# Patient Record
Sex: Male | Born: 1965 | Race: White | Hispanic: No | Marital: Married | Smoking: Current every day smoker
Health system: Southern US, Community
[De-identification: ages and names within clinical notes are randomized; demographics above are authoritative.]

## PROBLEM LIST (undated history)

## (undated) DIAGNOSIS — K219 Gastro-esophageal reflux disease without esophagitis: Secondary | ICD-10-CM

## (undated) DIAGNOSIS — L409 Psoriasis, unspecified: Secondary | ICD-10-CM

## (undated) DIAGNOSIS — E785 Hyperlipidemia, unspecified: Secondary | ICD-10-CM

## (undated) DIAGNOSIS — J45909 Unspecified asthma, uncomplicated: Secondary | ICD-10-CM

## (undated) DIAGNOSIS — G709 Myoneural disorder, unspecified: Secondary | ICD-10-CM

## (undated) DIAGNOSIS — E7800 Pure hypercholesterolemia, unspecified: Secondary | ICD-10-CM

## (undated) DIAGNOSIS — K7460 Unspecified cirrhosis of liver: Secondary | ICD-10-CM

## (undated) DIAGNOSIS — I10 Essential (primary) hypertension: Secondary | ICD-10-CM

## (undated) DIAGNOSIS — T7840XA Allergy, unspecified, initial encounter: Secondary | ICD-10-CM

## (undated) DIAGNOSIS — J449 Chronic obstructive pulmonary disease, unspecified: Secondary | ICD-10-CM

## (undated) DIAGNOSIS — R569 Unspecified convulsions: Secondary | ICD-10-CM

## (undated) HISTORY — DX: Unspecified convulsions: R56.9

## (undated) HISTORY — PX: HERNIA REPAIR: SHX51

## (undated) HISTORY — DX: Allergy, unspecified, initial encounter: T78.40XA

## (undated) HISTORY — DX: Myoneural disorder, unspecified: G70.9

## (undated) HISTORY — DX: Gastro-esophageal reflux disease without esophagitis: K21.9

---

## 2006-11-07 ENCOUNTER — Emergency Department: Payer: Self-pay | Admitting: Emergency Medicine

## 2008-04-12 ENCOUNTER — Emergency Department: Payer: Self-pay | Admitting: Emergency Medicine

## 2008-08-25 ENCOUNTER — Emergency Department: Payer: Self-pay | Admitting: Emergency Medicine

## 2011-04-03 LAB — ETHANOL: Ethanol %: <0.003 % (ref 0.000–0.080)

## 2011-04-03 LAB — DRUG SCREEN, URINE
Amphetamines, Ur Screen: NEGATIVE (ref ?–1000)
Benzodiazepine, Ur Scrn: NEGATIVE (ref ?–200)
MDMA (Ecstasy)Ur Screen: NEGATIVE (ref ?–500)
Methadone, Ur Screen: NEGATIVE (ref ?–300)
Phencyclidine (PCP) Ur S: NEGATIVE (ref ?–25)

## 2011-04-03 LAB — COMPREHENSIVE METABOLIC PANEL
Albumin: 4.9 g/dL (ref 3.4–5.0)
Alkaline Phosphatase: 45 U/L — ABNORMAL LOW (ref 50–136)
BUN: 13 mg/dL (ref 7–18)
Chloride: 107 mmol/L (ref 98–107)
Co2: 23 mmol/L (ref 21–32)
EGFR (African American): >60
Glucose: 93 mg/dL (ref 65–99)
Osmolality: 281 (ref 275–301)
SGOT(AST): 26 U/L (ref 15–37)
SGPT (ALT): 34 U/L
Sodium: 141 mmol/L (ref 136–145)
Total Protein: 8 g/dL (ref 6.4–8.2)

## 2011-04-03 LAB — URINALYSIS, COMPLETE
Leukocyte Esterase: NEGATIVE
Ph: 5 (ref 4.5–8.0)
Protein: NEGATIVE
RBC,UR: 1 /HPF (ref 0–5)

## 2011-04-03 LAB — TSH: Thyroid Stimulating Horm: 0.99 u[IU]/mL

## 2011-04-03 LAB — CBC
HGB: 17 g/dL (ref 13.0–18.0)
MCH: 32.2 pg (ref 26.0–34.0)
MCHC: 33.7 g/dL (ref 32.0–36.0)
Platelet: 213 10*3/uL (ref 150–440)
RBC: 5.29 10*6/uL (ref 4.40–5.90)
RDW: 12.7 % (ref 11.5–14.5)

## 2011-04-04 ENCOUNTER — Inpatient Hospital Stay: Payer: Self-pay | Admitting: Psychiatry

## 2011-04-05 LAB — LIPID PANEL
Ldl Cholesterol, Calc: 123 mg/dL — ABNORMAL HIGH (ref 0–100)
VLDL Cholesterol, Calc: 31 mg/dL (ref 5–40)

## 2011-04-05 LAB — CBC WITH DIFFERENTIAL/PLATELET
Basophil #: 0.1 10*3/uL (ref 0.0–0.1)
Eosinophil %: 1.6 %
Lymphocyte #: 1.9 10*3/uL (ref 1.0–3.6)
Monocyte #: 0.7 10*3/uL (ref 0.0–0.7)
Monocyte %: 10.7 %
Neutrophil %: 58.4 %
Platelet: 195 10*3/uL (ref 150–440)
RBC: 5.01 10*6/uL (ref 4.40–5.90)
RDW: 12.8 % (ref 11.5–14.5)
WBC: 6.7 10*3/uL (ref 3.8–10.6)

## 2011-04-05 LAB — FOLATE: Folic Acid: 9.2 ng/mL (ref 3.1–100.0)

## 2011-12-07 ENCOUNTER — Emergency Department: Payer: Self-pay | Admitting: Emergency Medicine

## 2012-04-12 ENCOUNTER — Emergency Department: Payer: Self-pay | Admitting: Internal Medicine

## 2012-04-17 ENCOUNTER — Other Ambulatory Visit: Payer: Self-pay

## 2012-04-17 LAB — HEPATIC FUNCTION PANEL A (ARMC)
Albumin: 4.4 g/dL (ref 3.4–5.0)
Alkaline Phosphatase: 58 U/L (ref 50–136)
SGPT (ALT): 28 U/L (ref 12–78)

## 2012-04-23 ENCOUNTER — Ambulatory Visit: Payer: Self-pay

## 2012-05-01 ENCOUNTER — Ambulatory Visit: Payer: Self-pay

## 2012-05-01 LAB — HEPATIC FUNCTION PANEL A (ARMC)
Albumin: 4 g/dL (ref 3.4–5.0)
Alkaline Phosphatase: 54 U/L (ref 50–136)
SGOT(AST): 22 U/L (ref 15–37)
SGPT (ALT): 37 U/L (ref 12–78)
Total Protein: 7.3 g/dL (ref 6.4–8.2)

## 2012-05-01 LAB — PROTIME-INR: INR: 1

## 2012-05-01 LAB — CREATININE, SERUM
Creatinine: 1.05 mg/dL (ref 0.60–1.30)
EGFR (Non-African Amer.): >60

## 2013-08-03 DIAGNOSIS — G40909 Epilepsy, unspecified, not intractable, without status epilepticus: Secondary | ICD-10-CM | POA: Insufficient documentation

## 2013-08-03 DIAGNOSIS — K7460 Unspecified cirrhosis of liver: Secondary | ICD-10-CM | POA: Insufficient documentation

## 2013-08-03 DIAGNOSIS — J45909 Unspecified asthma, uncomplicated: Secondary | ICD-10-CM | POA: Insufficient documentation

## 2013-10-16 IMAGING — CR DG LUMBAR SPINE 2-3V
1 series · 3 of 3 positions shown · non-contrast
Comparison: none

REASON FOR EXAM: pinched nerve 2 hernia fax to [facility] [phone]
COMMENTS:

[Series 1: t lumbar spine ap · 0.14mm/px · 3 of 3 slices shown]
[im 1/3]
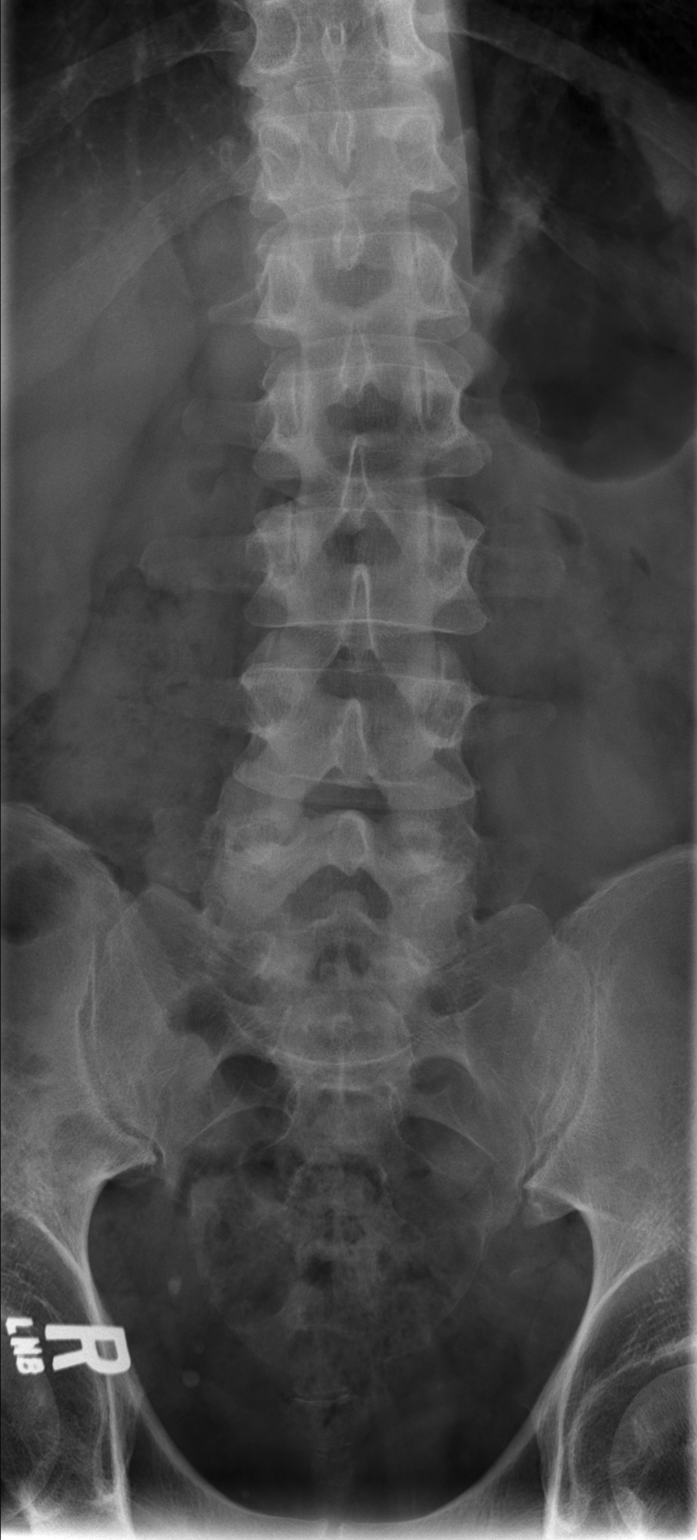
[im 2/3]
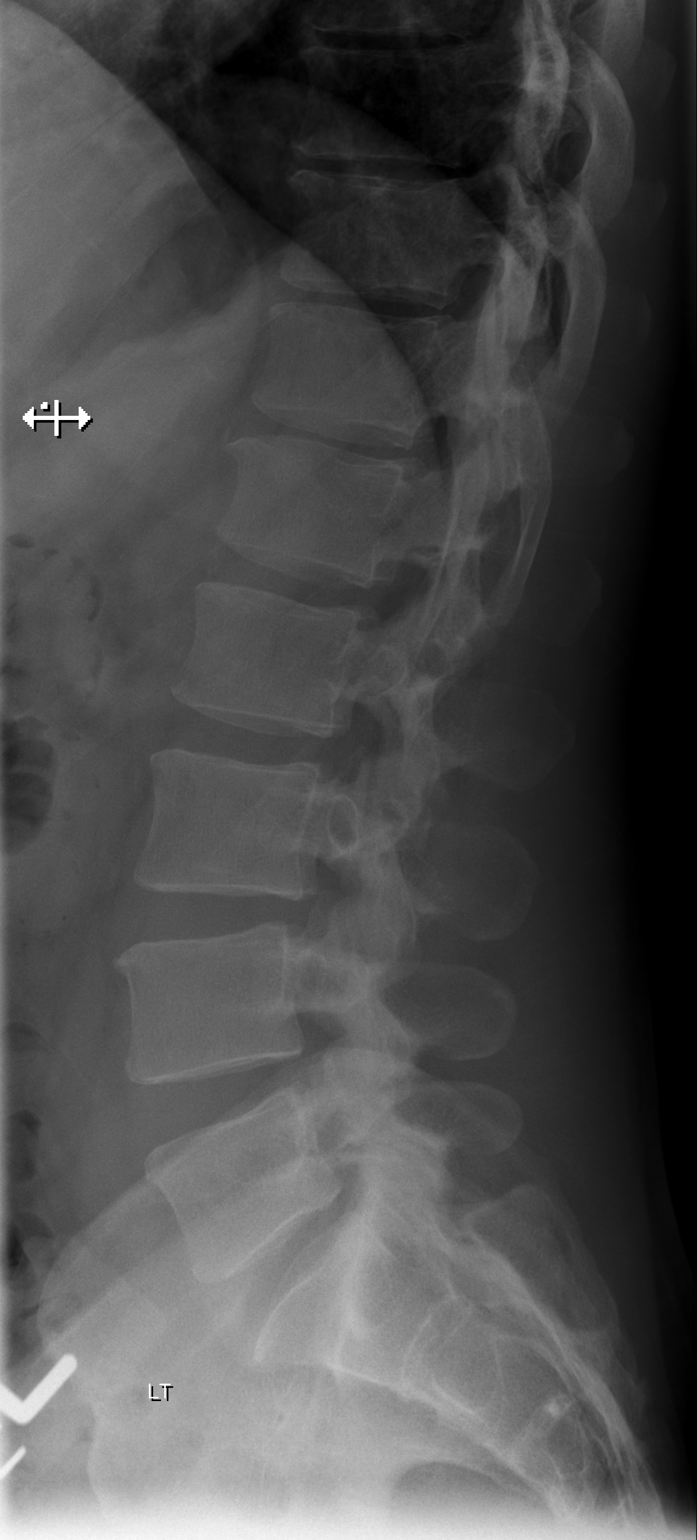
[im 3/3]
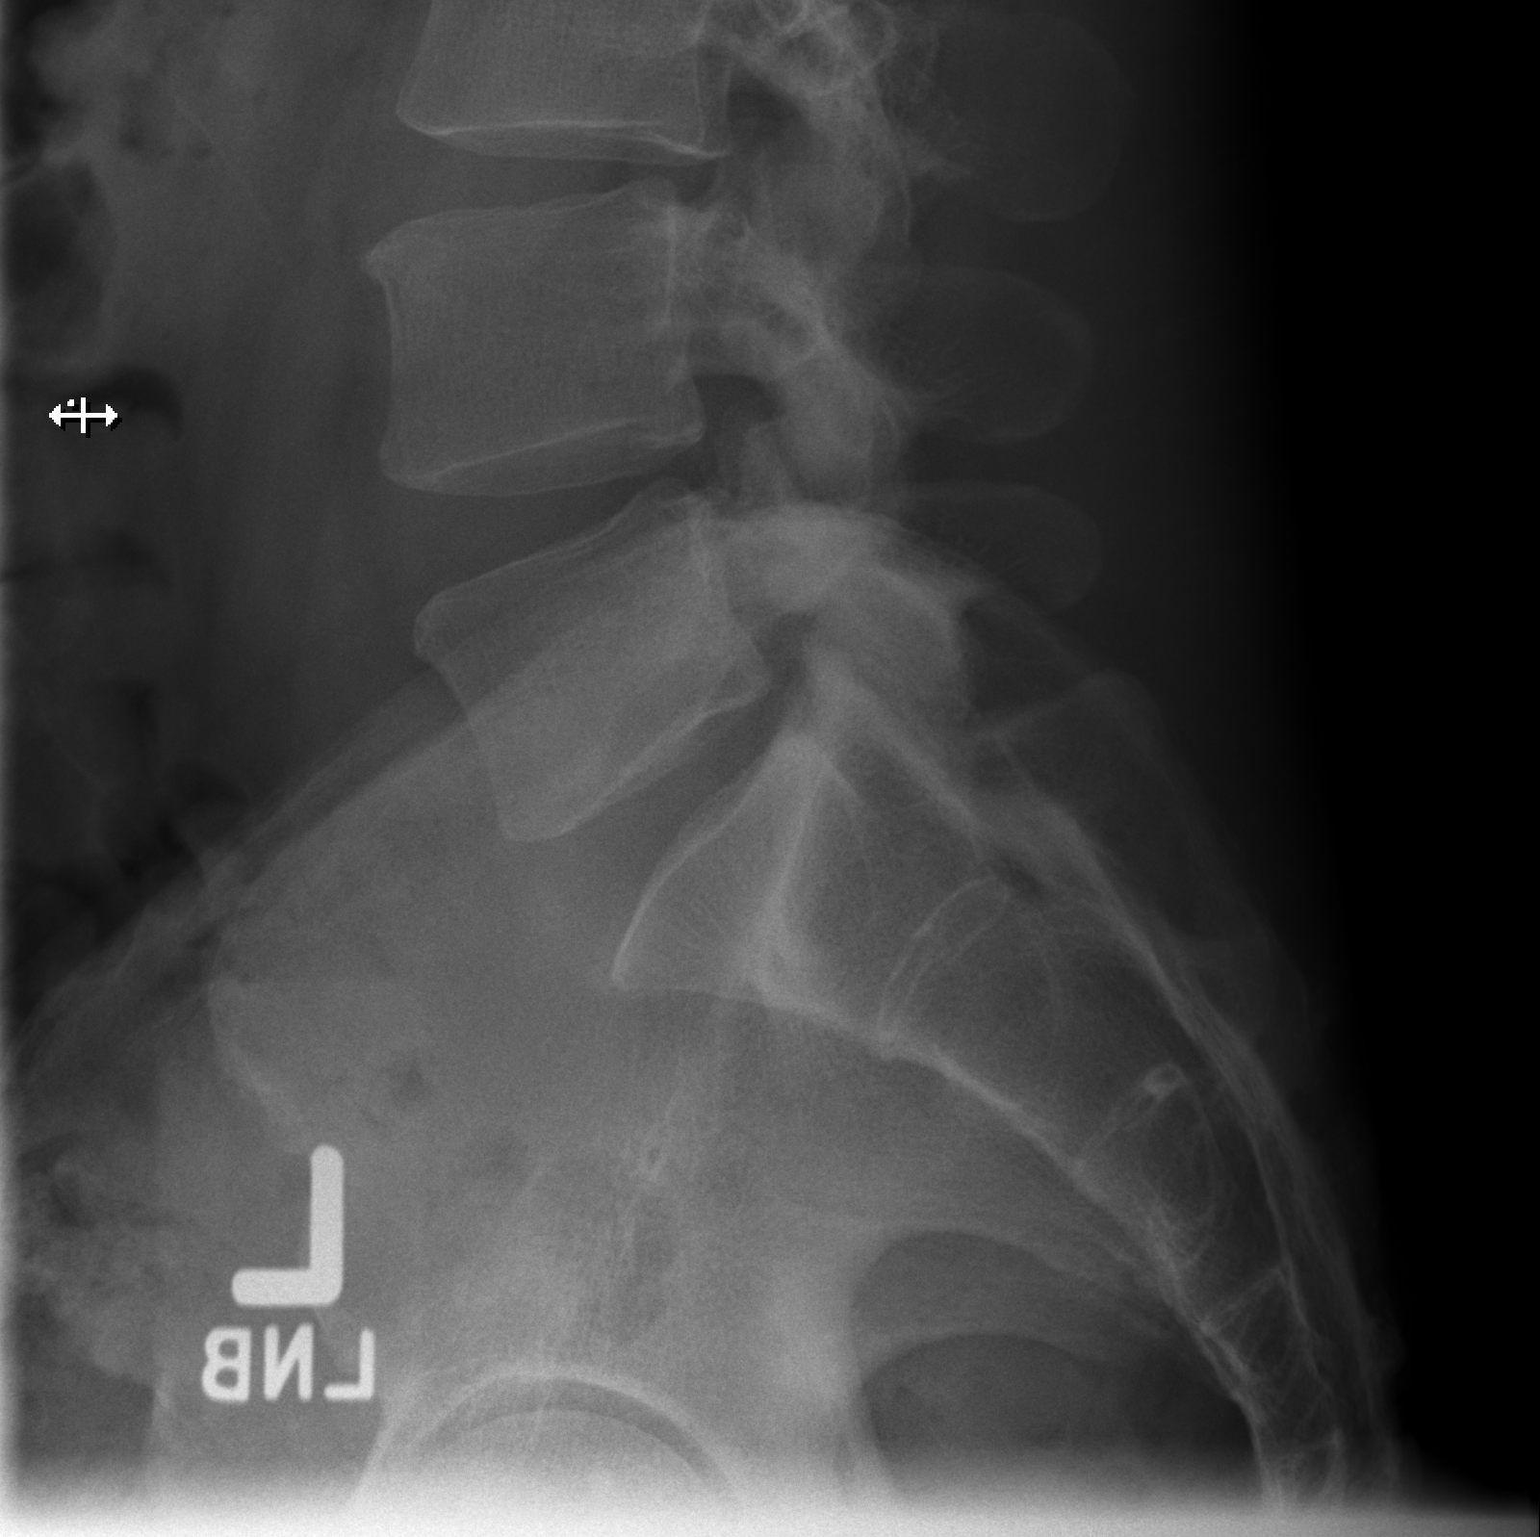

[3 of 3 positions shown; findings below may reference images not displayed]

PROCEDURE:     DXR - DXR LUMBAR SPINE AP AND LATERAL  - May 01, 2012  [DATE]

RESULT:     The lumbar vertebral bodies are preserved in height. The
intervertebral disc space heights are well-maintained. There is no
spondylolisthesis. The pedicles and transverse processes appear intact. The
observed portions of the sacrum are normal.
IMPRESSION: There is no acute bony abnormality of the lumbar spine. No
significant degenerative change is demonstrated either.

[REDACTED]

## 2014-05-12 LAB — CBC AND DIFFERENTIAL
Neutrophils Absolute: 4 /uL
WBC: 7.6 10*3/mL

## 2014-05-12 LAB — LIPID PANEL
Cholesterol: 230 mg/dL — AB (ref 0–200)
HDL: 43 mg/dL (ref 35–70)
Triglycerides: 707 mg/dL — AB (ref 40–160)

## 2014-05-12 LAB — TSH: TSH: 1.87 u[IU]/mL (ref 0.41–5.90)

## 2014-05-12 LAB — BASIC METABOLIC PANEL
BUN: 10 mg/dL (ref 4–21)
Creatinine: 1 mg/dL (ref 0.6–1.3)
GLUCOSE: 98 mg/dL
SODIUM: 140 mmol/L (ref 137–147)

## 2014-05-12 LAB — HEPATIC FUNCTION PANEL
BILIRUBIN DIRECT: 0.13 mg/dL (ref 0.01–0.4)
BILIRUBIN, TOTAL: 0.3 mg/dL

## 2014-07-17 NOTE — H&P (Signed)
PATIENT NAME:  Mathew Harper, Mathew Harper MR#:  [id number] DATE OF BIRTH:  05/10/1965  DATE OF ADMISSION:  04/04/2011  REFERRING PHYSICIAN: Maurilio Lovely, MD  ADMITTING PHYSICIAN: Caryn Section, MD  REASON FOR ADMISSION: Suicidal thoughts.   IDENTIFYING INFORMATION: Mathew Harper is a 49 year old currently separated Caucasian male living in the [place] area with his mother. He does have a history of depression and alcohol dependence. He is not currently receiving any outpatient psychiatric treatment.   HISTORY OF PRESENT ILLNESS: Mathew Harper is a 49 year old currently separated Caucasian male with a history of depression, per the patient, dating back to childhood and no prior inpatient or outpatient psychiatric treatment who voluntarily came on his own to the Emergency Room. The patient states that he walked from [facility] to the hospital due to worsening depressive symptoms and suicidal thoughts over the past 1-1/2 weeks. He denies any specific plan. The patient says he has been depressed secondary to a work-related back injury dating back 5 or 6 years, which has caused him not to be able to work. He has not also been able to find any job and due to financial problems he and his wife separated approximately six months ago. The patient does report problems with insomnia saying he only sleeps two to three hours per night as well as depressive symptoms, feelings of hopelessness and helplessness, frequent crying spells, decreased energy level, and anhedonia. He denies any change in appetite, weight gain or weight loss. He denies any history of any prior suicide attempts in the past or inpatient psychiatric hospitalizations. He denies any visual hallucinations, paranoid thoughts or delusions, but has been having auditory hallucinations at times telling him to kill himself. He was able to verbally contract for safety inside of the hospital, but was unable to contract for safety outside of the hospital. He has also been  drinking alcohol daily, about 7 to 8 beers, since his early 47s. The patient however says he has not had any alcohol in the past two weeks. He denies any other illicit drug use including cocaine, cannabis, opiate, or stimulant use. Toxicology screen in the emergency room was negative for all substances. Ethanol level was less than 3. He denies any symptoms consistent with bipolar mania including grandiose delusions, hyperreligious thoughts, hypersexual behavior, or impulsive behavior such as spending sprees or gambling.   PAST PSYCHIATRIC HISTORY: The patient reports he has had depression "his whole life". He denies any prior inpatient psychiatric hospitalizations or suicide attempts. He has not ever seen a psychiatrist, but he did talk to a counselor in childhood. He denies ever being on any psychotropic medications in the past.   SUBSTANCE ABUSE HISTORY: The patient reports he is drinking alcohol, 7 to 8 beers, daily since his early 35s. He does report alcohol related blackouts and shakes and tremors when he stops drinking. In addition he does have a seizure history. It is unclear whether or not these are alcohol withdrawal related seizures. The patient says he tried marijuana years ago, but he does not like it and does not use it on a regular basis. He denies any cocaine, opiate, or stimulant use. He used to smoke for a five year period, but then quit four years ago.   FAMILY PSYCHIATRIC HISTORY: The patient reports that his father was bipolar.   PAST MEDICAL HISTORY:  1. Asthma.  2. Chronic back pain. 3. Early stage of cirrhosis of the liver, per the patient. 4. Right hernia repair in 1982.  5. The  patient reports a history of seizures that he says sometimes occur 2 to 3 times a month. He was seen at [facility] and admitted after having a seizure back in 2004 but was not discharged on any seizure medications. It is still unclear whether or not these are alcohol withdrawal related seizures. No history of  any TBI.   OUTPATIENT MEDICATIONS: Albuterol, Advair, and he also states he takes over-the-counter sleeping pills.   ALLERGIES: No known drug allergies.   SOCIAL HISTORY: The patient says he was born and raised in [place], [place] and came to [place] over 20 years ago. He is currently living with his mother in [place]. His father passed away two years ago. His says his parents never divorced. He has two brothers and one sister. His sister lives in the house next door to him. He denies any history of any physical or sexual abuse. He has an eighth-grade education but never obtained his GED. The patient worked in the past operating heavy Engineer, manufacturing systems. He has been unemployed for the past four years. He is not currently on disability but is trying to apply for disability. 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 The patient has been married since 1988, but he separated from his wife six months ago. He has a 51 year old son and a 45 year old daughter. LEGAL HISTORY: He does have a history of three DUIs in the past. He denies any current pending charges.   MENTAL STATUS EXAM: Mathew Harper is a 49 year old Caucasian male who is wearing burgundy scrub pants and a burgundy scrub top. He was fully alert and oriented to time, place, and situation. He was somewhat disheveled. Speech was regular rate and rhythm, fluent and coherent. Mood was depressed and affect was congruent. Thought processes were linear, logical, and goal-directed. He did endorse suicidal thoughts but denied any specific plan. He denied any current homicidal thoughts. He has been having intermittent auditory hallucinations telling him to kill himself, but he denies any current hallucinations. He denies any visual hallucinations, paranoid thoughts, or delusions. Attention and concentration are fair to good. Recall was three out of three initially and three out of three after 5 minutes. Judgment and insight are  good. The patient did have difficulty with serial sevens after 93, but he could do simple calculations without difficulty. He had difficulty naming the presidents backwards after Bush. He was able to spell world backwards correctly. When asked about proverbs, the patient said "I don't know".  SUICIDE RISK ASSESSMENT: At this time Mr. Strollo is at a moderate level of risk for harm to self and others secondary to auditory hallucinations and suicidal thoughts. He says his brother-in-law has locked up his guns. In addition, he has other psychosocial stressors including chronic back pain, unemployment, and recent separation from his wife.  REVIEW OF SYSTEMS: CONSTITUTIONAL: The patient denies any weakness, fatigue, or weight changes. He denies any fever, chills, or night sweats. HEENT: He denies any headaches, dizziness, or change in vision including blurred vision. He denies any hearing loss. RESPIRATORY: He does complain of some shortness of breath and had some wheezing. No cough. CARDIOVASCULAR: He denies any chest pain or orthopnea. GASTROINTESTINAL: He denies any nausea or vomiting, but complain of some mild right lower quadrant pain. He denies any change in bowel movements. GENITOURINARY: He denies incontinence or problems with frequency of urine. ENDOCRINE: He denies any heat or cold intolerance. LYMPHATIC: He denies any anemia or easy bruising. MUSCULOSKELETAL: He denies  any muscle or joint pain. NEUROLOGIC: He denies any tingling or weakness. PSYCHIATRIC: Please see history of present illness.   PHYSICAL EXAMINATION:   VITAL SIGNS: Blood pressure 129/84, heart rate 100, respirations 18, and temperature 98.3.   HEENT: Normocephalic, atraumatic. Pupils are equal, round, and reactive to light and accommodation. Extraocular movements are intact. Oral mucosa was moist. No lesions noted.   NECK: Supple. No cervical lymphadenopathy or thyromegaly present.   LUNGS: The patient did have some wheezing  posteriorly bilaterally. No crackles, rales, or rhonchi.   HEART: S1 and S2 present, regular rate and rhythm. No murmurs, rubs or gallops.  ABDOMEN: Soft. Normoactive bowel sounds present in all four quadrants. The patient did have some mild right lower quadrant tenderness. No masses present. No distention. No rebound tenderness.   EXTREMITIES: +2 pedal pulses bilaterally. No rashes, cyanosis, clubbing, or edema.   NEUROLOGIC: Cranial nerves II through XII grossly intact. Gait was normal and steady.  LABS/STUDIES: Ethanol level was less than 3. Toxicology screen is negative for all substances. BMP is within normal limits. Glucose is 93, alkaline phosphatase 45, AST 26, and ALT 34. TSH is 0.99. White blood cell count is 15.6, hemoglobin 17.0, and platelet count 213.   Urinalysis is nitrite and leukocyte esterase negative, less than 1 WBC.  EKG showed a ventricular rate of 102 with a QTc interval of 411.   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  AXIS II: Deferred.   AXIS III: Asthma, chronic back pain, and cirrhosis of the liver.   AXIS IV: Severe - financial problems, separation from wife, occupational problems, history of legal problems.   AXIS V: GAF at present equals 25.   ASSESSMENT AND TREATMENT RECOMMENDATIONS: Mr. Thomasena EdisCollins is a 49 year old currently separated Caucasian male with a history of recurrent depression with psychotic features  as he is reporting auditory hallucinations telling him to kill himself. He denies any other psychotic features including visual hallucinations. He was unable to contract for safety outside of the hospital and has been having suicidal thoughts for the past 1-1/2 weeks. We will admit to inpatient psychiatry for medication management, safety, and stabilization.  1. 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 daily for  depression and anxiety and Risperdal 2 mg p.o. nightly for psychosis. We will also plan to start trazodone 50 mg p.o. nightly for insomnia. We will check EKG to rule out QTc prolongation, lipid panel in the a.m. as well as B12 and folate level in the a.m. 2. Alcohol dependence: The patient was advised to abstain from alcohol and all illicit drugs as it may worsen liver problems as well as mood symptoms. He has not had a drink in the past two weeks and ethanol level was less than 3. No need to place on CIWA for alcohol withdrawal symptoms, if he has truly not been drinking over the past two weeks. We will refer for outpatient substance abuse treatment as he is opposed to residential substance abuse treatment at this time.  3. Asthma: We will plan to restart albuterol and asthma inhalers. 4. Chronic back pain: We will give Ultram 50 mg p.o. every six hours p.r.n. for pain.  5. Leukocytosis: White blood cell count is 15.6. Chest x-ray is pending. We will plan to recheck WBC in the a.m. 6. Disposition: The patient has a stable living situation. Mental health follow-up will be with [facility] as the patient does not currently have healthcare insurance. The risks, benefits, and alternative treatments were discussed with the patient and the patient agreed to stay in the hospital voluntarily.  ____________________________ Doralee Albino. Maryruth Bun, MD akk:slb D: 04/04/2011 11:16:59 ET T: 04/04/2011 11:38:44 ET JOB#: [id number]  cc: Aarti K. Maryruth Bun, MD, <Dictator> Darliss Ridgel MD ELECTRONICALLY SIGNED 04/05/2011 19:36

## 2014-07-26 ENCOUNTER — Ambulatory Visit: Payer: Self-pay

## 2015-03-20 ENCOUNTER — Other Ambulatory Visit: Payer: Self-pay

## 2015-03-20 ENCOUNTER — Emergency Department: Payer: Self-pay

## 2015-03-20 ENCOUNTER — Emergency Department
Admission: EM | Admit: 2015-03-20 | Discharge: 2015-03-20 | Disposition: A | Discharge status: Home / Self Care | Payer: Self-pay | Attending: Emergency Medicine | Admitting: Emergency Medicine

## 2015-03-20 DIAGNOSIS — F17200 Nicotine dependence, unspecified, uncomplicated: Secondary | ICD-10-CM | POA: Insufficient documentation

## 2015-03-20 DIAGNOSIS — I10 Essential (primary) hypertension: Secondary | ICD-10-CM | POA: Insufficient documentation

## 2015-03-20 DIAGNOSIS — J441 Chronic obstructive pulmonary disease with (acute) exacerbation: Secondary | ICD-10-CM | POA: Insufficient documentation

## 2015-03-20 DIAGNOSIS — R000 Tachycardia, unspecified: Secondary | ICD-10-CM | POA: Insufficient documentation

## 2015-03-20 HISTORY — DX: Chronic obstructive pulmonary disease, unspecified: J44.9

## 2015-03-20 HISTORY — DX: Unspecified asthma, uncomplicated: J45.909

## 2015-03-20 HISTORY — DX: Essential (primary) hypertension: I10

## 2015-03-20 HISTORY — DX: Pure hypercholesterolemia, unspecified: E78.00

## 2015-03-20 LAB — COMPREHENSIVE METABOLIC PANEL
ALT: 21 U/L (ref 17–63)
AST: 25 U/L (ref 15–41)
Albumin: 4.4 g/dL (ref 3.5–5.0)
Alkaline Phosphatase: 31 U/L — ABNORMAL LOW (ref 38–126)
Anion gap: 7 (ref 5–15)
BILIRUBIN TOTAL: 0.9 mg/dL (ref 0.3–1.2)
BUN: 8 mg/dL (ref 6–20)
CO2: 25 mmol/L (ref 22–32)
Calcium: 8.6 mg/dL — ABNORMAL LOW (ref 8.9–10.3)
Chloride: 102 mmol/L (ref 101–111)
Creatinine, Ser: 1.14 mg/dL (ref 0.61–1.24)
GFR calc Af Amer: >60 mL/min (ref >60)
Glucose, Bld: 135 mg/dL — ABNORMAL HIGH (ref 65–99)
POTASSIUM: 3.4 mmol/L — AB (ref 3.5–5.1)
Sodium: 134 mmol/L — ABNORMAL LOW (ref 135–145)
TOTAL PROTEIN: 7.4 g/dL (ref 6.5–8.1)

## 2015-03-20 LAB — CBC
HEMATOCRIT: 43.6 % (ref 40.0–52.0)
Hemoglobin: 15 g/dL (ref 13.0–18.0)
MCH: 31.9 pg (ref 26.0–34.0)
MCHC: 34.4 g/dL (ref 32.0–36.0)
MCV: 92.8 fL (ref 80.0–100.0)
PLATELETS: 151 10*3/uL (ref 150–440)
RBC: 4.7 MIL/uL (ref 4.40–5.90)
RDW: 13.1 % (ref 11.5–14.5)
WBC: 6.7 10*3/uL (ref 3.8–10.6)

## 2015-03-20 LAB — BRAIN NATRIURETIC PEPTIDE: B Natriuretic Peptide: 27 pg/mL (ref 0.0–100.0)

## 2015-03-20 LAB — TROPONIN I

## 2015-03-20 MED ORDER — IPRATROPIUM-ALBUTEROL 0.5-2.5 (3) MG/3ML IN SOLN
3.0000 mL | Freq: Once | RESPIRATORY_TRACT | Status: AC
  Administered 2015-03-20: 3 mL via RESPIRATORY_TRACT
  Filled 2015-03-20: qty 9

## 2015-03-20 MED ORDER — IPRATROPIUM-ALBUTEROL 0.5-2.5 (3) MG/3ML IN SOLN
3.0000 mL | Freq: Once | RESPIRATORY_TRACT | Status: AC
  Administered 2015-03-20: 3 mL via RESPIRATORY_TRACT
  Filled 2015-03-20: qty 3

## 2015-03-20 MED ORDER — PREDNISONE 10 MG (21) PO TBPK: 10.0000 mg | ORAL_TABLET | Freq: Every day | ORAL | Status: DC

## 2015-03-20 MED ORDER — LEVOFLOXACIN 500 MG PO TABS: 500.0000 mg | ORAL_TABLET | Freq: Every day | ORAL | Status: DC

## 2015-03-20 MED ORDER — IPRATROPIUM-ALBUTEROL 0.5-2.5 (3) MG/3ML IN SOLN
3.0000 mL | Freq: Once | RESPIRATORY_TRACT | Status: AC
Start: 2015-03-20 — End: 2015-03-20
  Administered 2015-03-20: 3 mL via RESPIRATORY_TRACT
  Filled 2015-03-20: qty 3

## 2015-03-20 MED ORDER — METHYLPREDNISOLONE SODIUM SUCC 125 MG IJ SOLR
125.0000 mg | Freq: Once | INTRAMUSCULAR | Status: AC
Start: 2015-03-20 — End: 2015-03-20
  Administered 2015-03-20: 125 mg via INTRAVENOUS
  Filled 2015-03-20: qty 2

## 2015-03-20 NOTE — Discharge Instructions (Signed)

## 2015-03-20 NOTE — ED Notes (Signed)
Patient in xray.  Radiology to bring to room 11 when he returns.

## 2015-03-20 NOTE — ED Notes (Signed)
PT placed on 2L [place] to ease with WOB.

## 2015-03-20 NOTE — ED Notes (Signed)
Pt arrives by POV. Pt states that he became increasing SOB last night. PT states he is SOB at baselines. Pt increase WOB.

## 2015-03-20 NOTE — ED Provider Notes (Signed)
[facility] Emergency Department Provider Note     Time seen: ----------------------------------------- 1:33 PM on 03/20/2015 -----------------------------------------    I have reviewed the triage vital signs and the nursing notes.   HISTORY  Chief Complaint Shortness of Breath    HPI Mathew Harper is a 49 y.o. male who presents ER for increased shortness of breath since last night. Patient states he has shortness of breath at baseline but has had increased work of breathing and has been using his home breathing treatments including albuterol and Atrovent without any improvement. Patient states changes in the weather usually makes symptoms worse, has not had any fevers chills or acute cough.   Past Medical History  Diagnosis Date  . Asthma   . COPD (chronic obstructive pulmonary disease) (HCC)   . Hypertension   . Hypercholesteremia     There are no active problems to display for this patient.   History reviewed. No pertinent past surgical history.  Allergies Review of patient's allergies indicates no known allergies.  Social History Social History  Substance Use Topics  . Smoking status: Current Every Day Smoker  . Smokeless tobacco: None  . Alcohol Use: Yes    Review of Systems Constitutional: Negative for fever. Eyes: Negative for visual changes. ENT: Negative for sore throat. Cardiovascular: Negative for chest pain. Respiratory: Positive shortness of breath and cough Gastrointestinal: Negative for abdominal pain, vomiting and diarrhea. Genitourinary: Negative for dysuria. Musculoskeletal: Negative for back pain. Skin: Negative for rash. Neurological: Negative for headaches, focal weakness or numbness.  10-point ROS otherwise negative.  ____________________________________________   PHYSICAL EXAM:  VITAL SIGNS: ED Triage Vitals  Enc Vitals Group     BP 03/20/15 1241 125/79 mmHg     Pulse Rate 03/20/15 1241 103   Resp 03/20/15 1241 26     Temp 03/20/15 1241 98.7 F (37.1 C)     Temp Source 03/20/15 1241 Oral     SpO2 03/20/15 1241 96 %     Weight 03/20/15 1241 200 lb (90.719 kg)     Height 03/20/15 1241  (1.753 m)     Head Cir --      Peak Flow --      Pain Score 03/20/15 1242 5     Pain Loc --      Pain Edu? --      Excl. in GC? --     Constitutional: Alert and oriented. Mild distress Eyes: Conjunctivae are normal. PERRL. Normal extraocular movements. ENT   Head: Normocephalic and atraumatic.   Nose: No congestion/rhinnorhea.   Mouth/Throat: Mucous membranes are moist.   Neck: No stridor. Cardiovascular: Rapid rate, regular rhythm. Normal and symmetric distal pulses are present in all extremities. No murmurs, rubs, or gallops. Respiratory: Tachypnea with wheezing and rhonchi bilaterally. Gastrointestinal: Soft and nontender. No distention. No abdominal bruits.  Musculoskeletal: Nontender with normal range of motion in all extremities. No joint effusions.  No lower extremity tenderness nor edema. Neurologic:  Normal speech and language. No gross focal neurologic deficits are appreciated. Speech is normal. No gait instability. Skin:  Skin is warm, dry and intact. No rash noted. Psychiatric: Mood and affect are normal. Speech and behavior are normal. Patient exhibits appropriate insight and judgment. ____________________________________________  EKG: Interpreted by me. Sinus tachycardia with a rate of 109 bpm, normal axis. No evidence of acute infarction. Normal QRS width.   ____________________________________________  ED COURSE:  Pertinent labs & imaging results that were available during my care of the  patient were reviewed by me and considered in my medical decision making (see chart for details). She is in acute distress, will need serial DuoNeb since steroids. Likely this is COPD exacerbation ____________________________________________    LABS (pertinent  positives/negatives)  Labs Reviewed  COMPREHENSIVE METABOLIC PANEL - Abnormal; Notable for the following:    Sodium 134 (*)    Potassium 3.4 (*)    Glucose, Bld 135 (*)    Calcium 8.6 (*)    Alkaline Phosphatase 31 (*)    All other components within normal limits  CBC  BRAIN NATRIURETIC PEPTIDE  TROPONIN I    RADIOLOGY Images were viewed by me  Chest x-ray  CRITICAL CARE Performed by: Emily FilbertWilliams, Sandria Mcenroe E   Total critical care time: 30 minutes  Critical care time was exclusive of separately billable procedures and treating other patients.  Critical care was necessary to treat or prevent imminent or life-threatening deterioration.  Critical care was time spent personally by me on the following activities: development of treatment plan with patient and/or surrogate as well as nursing, discussions with consultants, evaluation of patient's response to treatment, examination of patient, obtaining history from patient or surrogate, ordering and performing treatments and interventions, ordering and review of laboratory studies, ordering and review of radiographic studies, pulse oximetry and re-evaluation of patient's condition.  ____________________________________________  FINAL ASSESSMENT AND PLAN  COPD exacerbation  Plan: Patient with labs and imaging as dictated above. Patient received stacked DuoNeb as well as Solu-Medrol. Currently his breathing is probably 50% better only still has some wheezing. Offered admission, patient prefers to go home at this time. Uvula steroid taper as well as oral antibiotics and encouragement to use inhalers at home as prescribed.   Emily FilbertWilliams, Kery Haltiwanger E, MD   Emily FilbertJonathan E Taevyn Hausen, MD 03/20/15 [phone]

## 2015-03-21 ENCOUNTER — Encounter: Payer: Self-pay | Admitting: Emergency Medicine

## 2015-03-21 ENCOUNTER — Emergency Department: Payer: Self-pay

## 2015-03-21 ENCOUNTER — Inpatient Hospital Stay
Admission: EM | Admit: 2015-03-21 | Discharge: 2015-03-23 | DRG: 191 | Disposition: A | Discharge status: Home / Self Care | Payer: Self-pay | Attending: Internal Medicine | Admitting: Internal Medicine

## 2015-03-21 DIAGNOSIS — F17210 Nicotine dependence, cigarettes, uncomplicated: Secondary | ICD-10-CM | POA: Diagnosis present

## 2015-03-21 DIAGNOSIS — Z79899 Other long term (current) drug therapy: Secondary | ICD-10-CM

## 2015-03-21 DIAGNOSIS — E871 Hypo-osmolality and hyponatremia: Secondary | ICD-10-CM | POA: Diagnosis present

## 2015-03-21 DIAGNOSIS — I10 Essential (primary) hypertension: Secondary | ICD-10-CM

## 2015-03-21 DIAGNOSIS — D72829 Elevated white blood cell count, unspecified: Secondary | ICD-10-CM

## 2015-03-21 DIAGNOSIS — Z716 Tobacco abuse counseling: Secondary | ICD-10-CM

## 2015-03-21 DIAGNOSIS — F329 Major depressive disorder, single episode, unspecified: Secondary | ICD-10-CM | POA: Diagnosis present

## 2015-03-21 DIAGNOSIS — R739 Hyperglycemia, unspecified: Secondary | ICD-10-CM

## 2015-03-21 DIAGNOSIS — Z809 Family history of malignant neoplasm, unspecified: Secondary | ICD-10-CM

## 2015-03-21 DIAGNOSIS — J441 Chronic obstructive pulmonary disease with (acute) exacerbation: Secondary | ICD-10-CM | POA: Diagnosis present

## 2015-03-21 DIAGNOSIS — J45909 Unspecified asthma, uncomplicated: Secondary | ICD-10-CM | POA: Diagnosis present

## 2015-03-21 DIAGNOSIS — F1010 Alcohol abuse, uncomplicated: Secondary | ICD-10-CM | POA: Diagnosis present

## 2015-03-21 DIAGNOSIS — T380X5A Adverse effect of glucocorticoids and synthetic analogues, initial encounter: Secondary | ICD-10-CM | POA: Diagnosis present

## 2015-03-21 DIAGNOSIS — E785 Hyperlipidemia, unspecified: Secondary | ICD-10-CM

## 2015-03-21 DIAGNOSIS — J440 Chronic obstructive pulmonary disease with acute lower respiratory infection: Principal | ICD-10-CM | POA: Diagnosis present

## 2015-03-21 DIAGNOSIS — Z820 Family history of epilepsy and other diseases of the nervous system: Secondary | ICD-10-CM

## 2015-03-21 DIAGNOSIS — J209 Acute bronchitis, unspecified: Secondary | ICD-10-CM

## 2015-03-21 DIAGNOSIS — Z833 Family history of diabetes mellitus: Secondary | ICD-10-CM

## 2015-03-21 DIAGNOSIS — E876 Hypokalemia: Secondary | ICD-10-CM | POA: Diagnosis present

## 2015-03-21 DIAGNOSIS — Z7951 Long term (current) use of inhaled steroids: Secondary | ICD-10-CM

## 2015-03-21 DIAGNOSIS — R7301 Impaired fasting glucose: Secondary | ICD-10-CM | POA: Diagnosis present

## 2015-03-21 DIAGNOSIS — Z8249 Family history of ischemic heart disease and other diseases of the circulatory system: Secondary | ICD-10-CM

## 2015-03-21 DIAGNOSIS — K7460 Unspecified cirrhosis of liver: Secondary | ICD-10-CM | POA: Diagnosis present

## 2015-03-21 DIAGNOSIS — Z720 Tobacco use: Secondary | ICD-10-CM

## 2015-03-21 HISTORY — DX: Hyperlipidemia, unspecified: E78.5

## 2015-03-21 HISTORY — DX: Unspecified cirrhosis of liver: K74.60

## 2015-03-21 HISTORY — DX: Psoriasis, unspecified: L40.9

## 2015-03-21 LAB — CBC
HEMATOCRIT: 46.3 % (ref 40.0–52.0)
HEMOGLOBIN: 15.7 g/dL (ref 13.0–18.0)
MCH: 30.8 pg (ref 26.0–34.0)
MCHC: 33.9 g/dL (ref 32.0–36.0)
MCV: 90.9 fL (ref 80.0–100.0)
Platelets: 176 10*3/uL (ref 150–440)
RBC: 5.09 MIL/uL (ref 4.40–5.90)
RDW: 13.2 % (ref 11.5–14.5)
WBC: 11.7 10*3/uL — ABNORMAL HIGH (ref 3.8–10.6)

## 2015-03-21 LAB — COMPREHENSIVE METABOLIC PANEL
ALBUMIN: 4.3 g/dL (ref 3.5–5.0)
ALK PHOS: 32 U/L — AB (ref 38–126)
ALT: 23 U/L (ref 17–63)
ANION GAP: 11 (ref 5–15)
AST: 32 U/L (ref 15–41)
BILIRUBIN TOTAL: 0.7 mg/dL (ref 0.3–1.2)
BUN: 12 mg/dL (ref 6–20)
CALCIUM: 9.3 mg/dL (ref 8.9–10.3)
CO2: 21 mmol/L — ABNORMAL LOW (ref 22–32)
Chloride: 107 mmol/L (ref 101–111)
Creatinine, Ser: 0.94 mg/dL (ref 0.61–1.24)
GFR calc Af Amer: >60 mL/min (ref >60)
GLUCOSE: 151 mg/dL — AB (ref 65–99)
Potassium: 3.9 mmol/L (ref 3.5–5.1)
Sodium: 139 mmol/L (ref 135–145)
TOTAL PROTEIN: 8.2 g/dL — AB (ref 6.5–8.1)

## 2015-03-21 LAB — HEMOGLOBIN A1C: HEMOGLOBIN A1C: 5.4 % (ref 4.0–6.0)

## 2015-03-21 LAB — TROPONIN I: Troponin I: <0.03 ng/mL (ref <0.031)

## 2015-03-21 LAB — GLUCOSE, CAPILLARY
GLUCOSE-CAPILLARY: 179 mg/dL — AB (ref 65–99)
GLUCOSE-CAPILLARY: 224 mg/dL — AB (ref 65–99)

## 2015-03-21 MED ORDER — LEVOFLOXACIN 500 MG PO TABS
500.0000 mg | ORAL_TABLET | Freq: Every day | ORAL | Status: DC
  Administered 2015-03-21 – 2015-03-23 (×3): 500 mg via ORAL
  Filled 2015-03-21 (×4): qty 1

## 2015-03-21 MED ORDER — IPRATROPIUM-ALBUTEROL 0.5-2.5 (3) MG/3ML IN SOLN
3.0000 mL | Freq: Once | RESPIRATORY_TRACT | Status: AC
  Administered 2015-03-21: 3 mL via RESPIRATORY_TRACT
  Filled 2015-03-21: qty 3

## 2015-03-21 MED ORDER — PNEUMOCOCCAL VAC POLYVALENT 25 MCG/0.5ML IJ INJ
0.5000 mL | INJECTION | INTRAMUSCULAR | Status: AC
  Administered 2015-03-22: 0.5 mL via INTRAMUSCULAR
  Filled 2015-03-21: qty 0.5

## 2015-03-21 MED ORDER — INSULIN ASPART 100 UNIT/ML ~~LOC~~ SOLN
0.0000 [IU] | Freq: Every day | SUBCUTANEOUS | Status: DC
  Administered 2015-03-21: 22:00:00 2 [IU] via SUBCUTANEOUS
  Filled 2015-03-21: qty 2

## 2015-03-21 MED ORDER — METHYLPREDNISOLONE SODIUM SUCC 125 MG IJ SOLR
125.0000 mg | Freq: Once | INTRAMUSCULAR | Status: AC
  Administered 2015-03-21: 125 mg via INTRAVENOUS
  Filled 2015-03-21: qty 2

## 2015-03-21 MED ORDER — ROSUVASTATIN CALCIUM 5 MG PO TABS
5.0000 mg | ORAL_TABLET | Freq: Every day | ORAL | Status: DC
  Administered 2015-03-21 – 2015-03-23 (×3): 5 mg via ORAL
  Filled 2015-03-21 (×3): qty 1

## 2015-03-21 MED ORDER — ACETAMINOPHEN 325 MG PO TABS
650.0000 mg | ORAL_TABLET | Freq: Four times a day (QID) | ORAL | Status: DC | PRN
  Administered 2015-03-21 – 2015-03-22 (×3): 650 mg via ORAL
  Filled 2015-03-21 (×3): qty 2

## 2015-03-21 MED ORDER — METHYLPREDNISOLONE SODIUM SUCC 125 MG IJ SOLR
60.0000 mg | Freq: Four times a day (QID) | INTRAMUSCULAR | Status: DC
  Administered 2015-03-21 – 2015-03-23 (×8): 60 mg via INTRAVENOUS
  Filled 2015-03-21 (×8): qty 2

## 2015-03-21 MED ORDER — NORTRIPTYLINE HCL 25 MG PO CAPS
25.0000 mg | ORAL_CAPSULE | Freq: Every day | ORAL | Status: DC
  Administered 2015-03-21 – 2015-03-22 (×2): 25 mg via ORAL
  Filled 2015-03-21 (×3): qty 1

## 2015-03-21 MED ORDER — ACETAMINOPHEN 650 MG RE SUPP: 650.0000 mg | Freq: Four times a day (QID) | RECTAL | Status: DC | PRN

## 2015-03-21 MED ORDER — INSULIN ASPART 100 UNIT/ML ~~LOC~~ SOLN
0.0000 [IU] | Freq: Three times a day (TID) | SUBCUTANEOUS | Status: DC
  Administered 2015-03-21 – 2015-03-22 (×2): 2 [IU] via SUBCUTANEOUS
  Administered 2015-03-22: 12:00:00 3 [IU] via SUBCUTANEOUS
  Administered 2015-03-22: 2 [IU] via SUBCUTANEOUS
  Administered 2015-03-23: 12:00:00 3 [IU] via SUBCUTANEOUS
  Administered 2015-03-23: 2 [IU] via SUBCUTANEOUS
  Filled 2015-03-21 (×5): qty 2
  Filled 2015-03-21: qty 3

## 2015-03-21 MED ORDER — BUDESONIDE 0.25 MG/2ML IN SUSP
0.2500 mg | Freq: Two times a day (BID) | RESPIRATORY_TRACT | Status: DC
  Administered 2015-03-21 – 2015-03-23 (×3): 0.25 mg via RESPIRATORY_TRACT
  Filled 2015-03-21 (×3): qty 2

## 2015-03-21 MED ORDER — ALBUTEROL SULFATE (2.5 MG/3ML) 0.083% IN NEBU
2.5000 mg | INHALATION_SOLUTION | Freq: Once | RESPIRATORY_TRACT | Status: AC
  Administered 2015-03-21: 2.5 mg via RESPIRATORY_TRACT
  Filled 2015-03-21: qty 3

## 2015-03-21 MED ORDER — IPRATROPIUM-ALBUTEROL 0.5-2.5 (3) MG/3ML IN SOLN
3.0000 mL | Freq: Four times a day (QID) | RESPIRATORY_TRACT | Status: DC
  Administered 2015-03-21: 15:00:00 3 mL via RESPIRATORY_TRACT
  Filled 2015-03-21: qty 3

## 2015-03-21 MED ORDER — ENOXAPARIN SODIUM 40 MG/0.4ML ~~LOC~~ SOLN
40.0000 mg | SUBCUTANEOUS | Status: DC
  Administered 2015-03-21 – 2015-03-23 (×3): 40 mg via SUBCUTANEOUS
  Filled 2015-03-21 (×3): qty 0.4

## 2015-03-21 MED ORDER — IPRATROPIUM-ALBUTEROL 0.5-2.5 (3) MG/3ML IN SOLN: 3.0000 mL | RESPIRATORY_TRACT | Status: DC

## 2015-03-21 MED ORDER — LISINOPRIL 5 MG PO TABS
5.0000 mg | ORAL_TABLET | Freq: Every day | ORAL | Status: DC
  Administered 2015-03-21 – 2015-03-23 (×3): 5 mg via ORAL
  Filled 2015-03-21 (×3): qty 1

## 2015-03-21 MED ORDER — IPRATROPIUM-ALBUTEROL 0.5-2.5 (3) MG/3ML IN SOLN
3.0000 mL | RESPIRATORY_TRACT | Status: DC
  Administered 2015-03-21 – 2015-03-23 (×11): 3 mL via RESPIRATORY_TRACT
  Filled 2015-03-21 (×10): qty 3

## 2015-03-21 MED ORDER — LEVOFLOXACIN 500 MG PO TABS
500.0000 mg | ORAL_TABLET | Freq: Every day | ORAL | Status: DC
  Administered 2015-03-22: 500 mg via ORAL

## 2015-03-21 MED ORDER — NICOTINE 7 MG/24HR TD PT24
7.0000 mg | MEDICATED_PATCH | Freq: Every day | TRANSDERMAL | Status: DC
  Administered 2015-03-21: 7 mg via TRANSDERMAL
  Filled 2015-03-21 (×4): qty 1

## 2015-03-21 NOTE — Progress Notes (Signed)
Pt is alert and oriented x 4, from home with wife, came in for SOB, admitted for COPD exacerbation, on room air, good appetite, high falls, bed alarm activated, receiving IV steroids, sliding scale coverage, c/o headache and given tylenol. Uneventful shift.

## 2015-03-21 NOTE — ED Notes (Signed)
Was seen yesterday for diff breathing    Now having same sx's  With wheezing

## 2015-03-21 NOTE — ED Notes (Signed)
Attempted to call report X2. Was put on hold both times for 6-7 minutes with no response.

## 2015-03-21 NOTE — Progress Notes (Addendum)
Pt c/o chest tightness and feeling like he can't breath.  Pt's O2 sats are 94% on RA.  Spoke with Dr. Renae GlossWieting and had neb treatment changed to q4hrs.  Pt states he also takes nortriptyline at night to help him sleep, however does not remember what mg he takes.  Dr. Renae GlossWieting to put in new order. Orson Apeanielle Persia Lintner, RN

## 2015-03-21 NOTE — ED Provider Notes (Signed)
[facility] Emergency Department Provider Note  ____________________________________________  Time seen: 11:30 AM  I have reviewed the triage vital signs and the nursing notes.   HISTORY  Chief Complaint Respiratory Distress    HPI Mathew Harper is a 49 y.o. male who presents for shortness of breath. Patient was seen yesterday for similar complaints and diagnosed with COPD exacerbation. He was feeling better when he left. But he reports that his breathing difficult is worsened overnight. He is concerned that he may be developing a pneumonia. He denies fevers or chills. He does have chest tightness. No recent travel. No calf pain or swelling.     Past Medical History  Diagnosis Date  . Asthma   . COPD (chronic obstructive pulmonary disease) (HCC)   . Hypertension   . Hypercholesteremia     There are no active problems to display for this patient.   History reviewed. No pertinent past surgical history.  Current Outpatient Rx  Name  Route  Sig  Dispense  Refill  . albuterol (PROVENTIL HFA;VENTOLIN HFA) 108 (90 BASE) MCG/ACT inhaler   Inhalation   Inhale 1-2 puffs into the lungs every 6 (six) hours as needed for wheezing or shortness of breath.         Marland Kitchen albuterol (PROVENTIL) (2.5 MG/3ML) 0.083% nebulizer solution   Nebulization   Take 2.5 mg by nebulization every 6 (six) hours as needed for wheezing or shortness of breath.         . Fluticasone-Salmeterol (ADVAIR) 250-50 MCG/DOSE AEPB   Inhalation   Inhale 1 puff into the lungs 2 (two) times daily.         Marland Kitchen levofloxacin (LEVAQUIN) 500 MG tablet   Oral   Take 1 tablet (500 mg total) by mouth daily.   10 tablet   0   . lisinopril (PRINIVIL,ZESTRIL) 5 MG tablet   Oral   Take 5 mg by mouth daily.         . predniSONE (STERAPRED UNI-PAK 21 TAB) 10 MG (21) TBPK tablet   Oral   Take 1 tablet (10 mg total) by mouth daily. Take steroid taper pak as directed   21 tablet   0   .  rosuvastatin (CRESTOR) 5 MG tablet   Oral   Take 5 mg by mouth daily.           Allergies Review of patient's allergies indicates no known allergies.  No family history on file.  Social History Social History  Substance Use Topics  . Smoking status: Current Every Day Smoker  . Smokeless tobacco: None  . Alcohol Use: Yes    Review of Systems  Constitutional: Negative for fever. Eyes: Negative for visual changes. ENT: Negative for sore throat Cardiovascular: Positive chest tightness Respiratory: Positive for shortness of breath Gastrointestinal: Negative for abdominal pain. Genitourinary: Negative for dysuria. Musculoskeletal: Negative for back pain. Skin: Negative for rash. For diaphoresis Neurological: Negative for headaches  Psychiatric: Positive for anxiety    ____________________________________________   PHYSICAL EXAM:  VITAL SIGNS: ED Triage Vitals  Enc Vitals Group     BP 03/21/15 0919 146/86 mmHg     Pulse Rate 03/21/15 0919 108     Resp 03/21/15 0919 20     Temp 03/21/15 0919 98.3 F (36.8 C)     Temp Source 03/21/15 0919 Oral     SpO2 03/21/15 0919 94 %     Weight 03/21/15 0919 200 lb (90.719 kg)     Height 03/21/15  0919 5\' 9"  (1.753 m)     Head Cir --      Peak Flow --      Pain Score 03/21/15 1126 2     Pain Loc --      Pain Edu? --      Excl. in GC? --      Constitutional: Alert and oriented. Well appearing and in no distress. Eyes: Conjunctivae are normal.  ENT   Head: Normocephalic and atraumatic.   Mouth/Throat: Mucous membranes are moist. Cardiovascular: Tachycardia, regular rhythm. Normal and symmetric distal pulses are present in all extremities.  Respiratory: Increased respiratory effort with tachypnea and poor air movement bilaterally, scattered wheezes.  Gastrointestinal: Soft and non-tender in all quadrants. No distention. There is no CVA tenderness. Genitourinary: deferred Musculoskeletal: Nontender with normal range  of motion in all extremities. No lower extremity tenderness nor edema. Neurologic:  Normal speech and language. No gross focal neurologic deficits are appreciated. Skin:  Skin is warm, dry and intact. No rash noted. Psychiatric: Mood and affect are normal. Patient exhibits appropriate insight and judgment.  ____________________________________________    LABS (pertinent positives/negatives)  Labs Reviewed  CBC - Abnormal; Notable for the following:    WBC 11.7 (*)    All other components within normal limits  COMPREHENSIVE METABOLIC PANEL - Abnormal; Notable for the following:    CO2 21 (*)    Glucose, Bld 151 (*)    Total Protein 8.2 (*)    Alkaline Phosphatase 32 (*)    All other components within normal limits  TROPONIN I    ____________________________________________   EKG  ED ECG REPORT I, Mathew EveryKINNER, Mathew Harper, the attending physician, personally viewed and interpreted this ECG.  Date: 03/21/2015 EKG Time: 12:21 PM Rate: 109 Rhythm: Sinus tachycardia QRS Axis: normal Intervals: normal ST/T Wave abnormalities: normal Conduction Disutrbances: none Narrative Interpretation: unremarkable   ____________________________________________    RADIOLOGY I have personally reviewed any xrays that were ordered on this patient: Chest x-ray unremarkable  ____________________________________________   PROCEDURES  Procedure(s) performed: none  Critical Care performed: none  ____________________________________________   INITIAL IMPRESSION / ASSESSMENT AND PLAN / ED COURSE  Pertinent labs & imaging results that were available during my care of the patient were reviewed by me and considered in my medical decision making (see chart for details).  Patient with evidence of COPD exacerbation. Poor airflow bilaterally, tachypnea and tachycardia. We will give solumedrol and nebulizers.  Only mild improvement in symptoms. Patient will require admission for further  treatment  ____________________________________________   FINAL CLINICAL IMPRESSION(S) / ED DIAGNOSES  Final diagnoses:  COPD with exacerbation (HCC)     Mathew Everyobert Margueritte Guthridge, MD 03/21/15 1234

## 2015-03-21 NOTE — Progress Notes (Signed)
Took over care of pt from Brandi, RN.  Danielle Shalom Ware, RN 

## 2015-03-21 NOTE — H&P (Signed)
[facility] - [place] at [facility]   PATIENT NAME: Mathew MantisOtis Harper    MR#:  [id number]  DATE OF BIRTH:  06/15/1965  DATE OF ADMISSION:  03/21/2015  PRIMARY CARE PHYSICIAN: [facility]  REQUESTING/REFERRING PHYSICIAN: Jene Everyobert Kinner  CHIEF COMPLAINT:   Chief Complaint  Patient presents with  . Respiratory Distress    HISTORY OF PRESENT ILLNESS:  Mathew Harper  is a 49 y.o. male with a known history of COPD and asthma, depression, early cirrhosis, hypertension and hyperlipidemia. He presents back to the emergency room today. He's been having shortness of breath for 2 days. He's been coughing nonproductive phlegm and wheezing. He almost passed out today when getting up to go to the bathroom. He'll most passed out yesterday while walking the dog. He vomited yesterday. He was seen in the ER yesterday and sent home and prescriptions were supposedly called in but when they went to pick him up they were not there. Hospitalist services were contacted for further evaluation of COPD exacerbation.  PAST MEDICAL HISTORY:   Past Medical History  Diagnosis Date  . Asthma   . COPD (chronic obstructive pulmonary disease) (HCC)   . Hypertension   . Hypercholesteremia   . Cirrhosis of liver (HCC)   . Psoriasis   . Hyperlipidemia     PAST SURGICAL HISTORY:   Past Surgical History  Procedure Laterality Date  . Hernia repair      SOCIAL HISTORY:   Social History  Substance Use Topics  . Smoking status: Current Every Day Smoker -- 0.25 packs/day  . Smokeless tobacco: Not on file  . Alcohol Use: Yes    FAMILY HISTORY:   Family History  Problem Relation Age of Onset  . Cancer Mother   . Alzheimer's disease Mother   . CAD Father   . Diabetes Father     DRUG ALLERGIES:  No Known Allergies  REVIEW OF SYSTEMS:  CONSTITUTIONAL: No fever. Positive for fatigue.  EYES: No blurred or double vision. Wears reading glasses.  EARS, NOSE, AND THROAT: No  tinnitus or ear pain. No sore throat. 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 Positive for runny nose. Occasional dysphagia to solids RESPIRATORY: Positive for cough, shortness of breath, and wheezing. No hemoptysis.  CARDIOVASCULAR: Positive for chest pain, no orthopnea, edema.  GASTROINTESTINAL: Positive for nausea and vomiting. No diarrhea. Occasional abdominal pain. No blood in bowel movements. GENITOURINARY: Occasional dysuria ENDOCRINE: No polyuria, nocturia,  HEMATOLOGY: No anemia, easy bruising or bleeding SKIN: Psoriasis on face and chest MUSCULOSKELETAL: Positive for left knee pain and neck pain NEUROLOGIC: No tingling, numbness, weakness. Near-syncope yesterday and today. PSYCHIATRY: History of depression  MEDICATIONS AT HOME:   Prior to Admission medications   Medication Sig Start Date End Date Taking? Authorizing Provider  albuterol (PROVENTIL HFA;VENTOLIN HFA) 108 (90 BASE) MCG/ACT inhaler Inhale 1-2 puffs into the lungs every 6 (six) hours as needed for wheezing or shortness of breath.   Yes Historical Provider, MD  albuterol (PROVENTIL) (2.5 MG/3ML) 0.083% nebulizer solution Take 2.5 mg by nebulization every 6 (six) hours as needed for wheezing or shortness of breath.   Yes Historical Provider, MD  Fluticasone-Salmeterol (ADVAIR) 250-50 MCG/DOSE AEPB Inhale 1 puff into the lungs 2 (two) times daily.   Yes Historical Provider, MD  levofloxacin (LEVAQUIN) 500 MG tablet Take 1 tablet (500 mg total) by mouth daily. 03/20/15 03/30/15 Yes Emily FilbertJonathan E Williams, MD  lisinopril (PRINIVIL,ZESTRIL) 5 MG tablet Take 5  mg by mouth daily.   Yes Historical Provider, MD  predniSONE (STERAPRED UNI-PAK 21 TAB) 10 MG (21) TBPK tablet Take 1 tablet (10 mg total) by mouth daily. Take steroid taper pak as directed 03/20/15  Yes Emily Filbert, MD  rosuvastatin (CRESTOR) 5 MG tablet Take 5 mg by mouth daily.   Yes Historical Provider, MD      VITAL SIGNS:  Blood pressure 128/77,  pulse 110, temperature 98.3 F (36.8 C), temperature source Oral, resp. rate 16, height  (1.753 m), weight 90.719 kg (200 lb), SpO2 93 %.  PHYSICAL EXAMINATION:  GENERAL:  49 y.o.-year-old patient lying in the bed with no acute distress.  EYES: Pupils equal, round, reactive to light and accommodation. No scleral icterus. Extraocular muscles intact.  HEENT: Head atraumatic, normocephalic. Oropharynx and nasopharynx clear.  NECK:  Supple, no jugular venous distention. No thyroid enlargement, no tenderness.  LUNGS: Decreased breath sounds bilaterally, poor air entry throughout lung field. Positive for wheezing throughout entire lung field. No rales,rhonchi or crepitation. No use of accessory muscles of respiration.  CARDIOVASCULAR: S1, S2 normal. No murmurs, rubs, or gallops.  ABDOMEN: Soft, nontender, nondistended. Bowel sounds present. No organomegaly or mass.  EXTREMITIES: No pedal edema, cyanosis, or clubbing.  NEUROLOGIC: Cranial nerves II through XII are intact. Muscle strength 5/5 in all extremities. Sensation intact. Gait not checked.  PSYCHIATRIC: The patient is alert and oriented x 3.  SKIN: Psoriasis on 4 head and chest  LABORATORY PANEL:   CBC  Recent Labs Lab 03/21/15 1126  WBC 11.7*  HGB 15.7  HCT 46.3  PLT 176   ------------------------------------------------------------------------------------------------------------------  Chemistries   Recent Labs Lab 03/21/15 1126  NA 139  K 3.9  CL 107  CO2 21*  GLUCOSE 151*  BUN 12  CREATININE 0.94  CALCIUM 9.3  AST 32  ALT 23  ALKPHOS 32*  BILITOT 0.7   ------------------------------------------------------------------------------------------------------------------  Cardiac Enzymes  Recent Labs Lab 03/21/15 1126  TROPONINI <0.03   ------------------------------------------------------------------------------------------------------------------  RADIOLOGY:  Dg Chest 2 View  03/20/2015  CLINICAL  DATA:  Shortness of breath starting last night EXAM: CHEST  2 VIEW COMPARISON:  December 07, 2011 FINDINGS: The heart size and mediastinal contours are within normal limits. There is minimal diffuse increased pulmonary interstitium bilaterally unchanged compared to prior exam. There is no focal pneumonia or pleural effusion. The visualized skeletal structures are unremarkable. IMPRESSION: No focal pneumonia. Chronic minimal diffuse increased pulmonary interstitium unchanged compared to December 07, 2011 Electronically Signed   By: Sherian Rein M.D.   On: 03/20/2015 13:26   Dg Chest Portable 1 View  03/21/2015  CLINICAL DATA:  Patient having difficulty breathing that began today. Patient states that he has a HX of asthma. EXAM: PORTABLE CHEST 1 VIEW COMPARISON:  03/20/2015 FINDINGS: Midline trachea. Borderline cardiomegaly. Mediastinal contours otherwise within normal limits. No pleural effusion or pneumothorax. Clear lungs. Mild hyperinflation. IMPRESSION: No acute cardiopulmonary disease. Borderline cardiomegaly with mild hyperinflation. Electronically Signed   By: Jeronimo Greaves M.D.   On: 03/21/2015 11:50    EKG:   Sinus tachycardia and no acute ST-T wave changes  IMPRESSION AND PLAN:   1. COPD exacerbation- start IV Solu-Medrol and nebulizer treatments. By mouth antibiotics. 2. Tobacco abuse smoking cessation counseling done 3 minutes by me- I will give a low-dose nicotine patch. 3. Early cirrhosis I advised him to stop drinking on the weekends. 4. Hyperlipidemia unspecified continue Crestor 5. Essential hypertension continue low-dose lisinopril 6. Impaired fasting glucose check a  hemoglobin A1c, sliding scale while on steroids  All the records are reviewed and case discussed with ED provider. Management plans discussed with the patient, family and they are in agreement.  CODE STATUS: Full code  TOTAL TIME TAKING CARE OF THIS PATIENT: 50 minutes.    Alford Highland M.D on 03/21/2015  at 2:06 PM  Between 7am to 6pm - Pager - [phone]  After 6pm call admission pager [phone]  [facility]  Office  [phone]  CC: Primary care physician; [facility]

## 2015-03-22 LAB — CBC
HEMATOCRIT: 46.4 % (ref 40.0–52.0)
HEMOGLOBIN: 15.7 g/dL (ref 13.0–18.0)
MCH: 31.1 pg (ref 26.0–34.0)
MCHC: 33.9 g/dL (ref 32.0–36.0)
MCV: 91.6 fL (ref 80.0–100.0)
Platelets: 187 10*3/uL (ref 150–440)
RBC: 5.06 MIL/uL (ref 4.40–5.90)
RDW: 13.3 % (ref 11.5–14.5)
WBC: 14.2 10*3/uL — AB (ref 3.8–10.6)

## 2015-03-22 LAB — GLUCOSE, CAPILLARY
GLUCOSE-CAPILLARY: 170 mg/dL — AB (ref 65–99)
Glucose-Capillary: 173 mg/dL — ABNORMAL HIGH (ref 65–99)
Glucose-Capillary: 167 mg/dL — ABNORMAL HIGH (ref 65–99)
Glucose-Capillary: 145 mg/dL — ABNORMAL HIGH (ref 65–99)

## 2015-03-22 LAB — BASIC METABOLIC PANEL
ANION GAP: 11 (ref 5–15)
BUN: 15 mg/dL (ref 6–20)
CALCIUM: 9.1 mg/dL (ref 8.9–10.3)
CO2: 21 mmol/L — ABNORMAL LOW (ref 22–32)
Chloride: 107 mmol/L (ref 101–111)
Creatinine, Ser: 1.06 mg/dL (ref 0.61–1.24)
GFR calc Af Amer: >60 mL/min (ref >60)
Glucose, Bld: 183 mg/dL — ABNORMAL HIGH (ref 65–99)
POTASSIUM: 3.9 mmol/L (ref 3.5–5.1)
SODIUM: 139 mmol/L (ref 135–145)

## 2015-03-22 MED ORDER — HYDROCOD POLST-CPM POLST ER 10-8 MG/5ML PO SUER: 5.0000 mL | Freq: Two times a day (BID) | ORAL | Status: DC | PRN

## 2015-03-22 MED ORDER — NICOTINE POLACRILEX 2 MG MT GUM
2.0000 mg | CHEWING_GUM | OROMUCOSAL | Status: DC | PRN
  Administered 2015-03-23: 09:00:00 2 mg via ORAL
  Filled 2015-03-22 (×2): qty 1

## 2015-03-22 MED ORDER — GUAIFENESIN ER 600 MG PO TB12
600.0000 mg | ORAL_TABLET | Freq: Two times a day (BID) | ORAL | Status: DC
  Administered 2015-03-22 – 2015-03-23 (×3): 600 mg via ORAL
  Filled 2015-03-22 (×3): qty 1

## 2015-03-22 NOTE — Plan of Care (Signed)
Problem: Health Behavior/Discharge Planning: Goal: Ability to manage health-related needs will improve Outcome: Progressing Reviewed insulin use and sliding scale.  Instructed on prednisone and effect on blood sugar reading.  Pt verbalized understanding of teaching.  Problem: Physical Regulation: Goal: Ability to maintain clinical measurements within normal limits will improve Outcome: Progressing VSS, no complaints of pain.  Problem: Activity: Goal: Risk for activity intolerance will decrease Outcome: Progressing Increase in respiratory treatments to every 4 hours.  Pt reports better breathing and less conversational dyspnea.  Problem: Fluid Volume: Goal: Ability to maintain a balanced intake and output will improve Outcome: Progressing Pt reports no appetite but able to tolerate fluids without nausea or vomiting.

## 2015-03-22 NOTE — Progress Notes (Signed)
[facility] - [place] at [facility]   PATIENT NAME: Mathew Harper    MR#:  [id number]  DATE OF BIRTH:  Nov 12, 1965  SUBJECTIVE:  CHIEF COMPLAINT:   Chief Complaint  Patient presents with  . Respiratory Distress   patient is a 49 year old Caucasian male with history of tobacco abuse, ongoing, who presents to the hospital with complaints of shortness of breath, wheezing, cough. On arrival to emergency room, he was noted to have hyponatremia, hypokalemia. His chest x-ray revealed no active cardiopulmonary disease, but hyperinflation. Patient was admitted to the hospital for further evaluation and treatment of COPD exacerbation. He feels a little bit better today, still very short of breath especially on exertion, was weaned off oxygen.   Review of Systems  Constitutional: Negative for fever, chills and weight loss.  HENT: Negative for congestion.   Eyes: Negative for blurred vision and double vision.  Respiratory: Positive for cough, sputum production, shortness of breath and wheezing.   Cardiovascular: Negative for chest pain, palpitations, orthopnea, leg swelling and PND.  Gastrointestinal: Negative for nausea, vomiting, abdominal pain, diarrhea, constipation and blood in stool.  Genitourinary: Negative for dysuria, urgency, frequency and hematuria.  Musculoskeletal: Negative for falls.  Neurological: Negative for dizziness, tremors, focal weakness and headaches.  Endo/Heme/Allergies: Does not bruise/bleed easily.  Psychiatric/Behavioral: Negative for depression. The patient does not have insomnia.     VITAL SIGNS: Blood pressure 131/86, pulse 113, temperature 98 F (36.7 C), temperature source Oral, resp. rate 19, height  (1.753 m), weight 88.905 kg (196 lb), SpO2 93 %.  PHYSICAL EXAMINATION:   GENERAL:  49 y.o.-year-old patient lying in the bed with no acute distress.  EYES: Pupils equal, round, reactive to light and accommodation. No scleral icterus.  Extraocular muscles intact.  HEENT: Head atraumatic, normocephalic. Oropharynx and nasopharynx clear.  NECK:  Supple, no jugular venous distention. No thyroid enlargement, no tenderness.  LUNGS: Diminished breath sounds bilaterally, bilateral wheezing, rales,rhonchi , but no crepitation. Intermittently using accessory muscles of respiration.  CARDIOVASCULAR: S1, S2 normal. No murmurs, rubs, or gallops.  ABDOMEN: Soft, nontender, nondistended. Bowel sounds present. No organomegaly or mass.  EXTREMITIES: No pedal edema, cyanosis, or clubbing.  NEUROLOGIC: Cranial nerves II through XII are intact. Muscle strength 5/5 in all extremities. Sensation intact. Gait not checked.  PSYCHIATRIC: The patient is alert and oriented x 3.  SKIN: No obvious rash, lesion, or ulcer.   ORDERS/RESULTS REVIEWED:   CBC  Recent Labs Lab 03/20/15 1245 03/21/15 1126 03/22/15 0557  WBC 6.7 11.7* 14.2*  HGB 15.0 15.7 15.7  HCT 43.6 46.3 46.4  PLT 151 176 187  MCV 92.8 90.9 91.6  MCH 31.9 30.8 31.1  MCHC 34.4 33.9 33.9  RDW 13.1 13.2 13.3   ------------------------------------------------------------------------------------------------------------------  Chemistries   Recent Labs Lab 03/20/15 1245 03/21/15 1126 03/22/15 0557  NA 134* 139 139  K 3.4* 3.9 3.9  CL 102 107 107  CO2 25 21* 21*  GLUCOSE 135* 151* 183*  BUN CREATININE 1.14 0.94 1.06  CALCIUM 8.6* 9.3 9.1  AST 25 32  --   ALT 21 23  --   ALKPHOS 31* 32*  --   BILITOT 0.9 0.7  --    ------------------------------------------------------------------------------------------------------------------ estimated creatinine clearance is 91 mL/min (by C-G formula based on Cr of 1.06). ------------------------------------------------------------------------------------------------------------------ No results for input(s): TSH, T4TOTAL, T3FREE, THYROIDAB in the last 72 hours.  Invalid input(s): FREET3  Cardiac Enzymes  Recent  Labs Lab 03/20/15 1245  03/21/15 1126  TROPONINI <0.03 <0.03   ------------------------------------------------------------------------------------------------------------------ Invalid input(s): POCBNP ---------------------------------------------------------------------------------------------------------------  RADIOLOGY: Dg Chest Portable 1 View  03/21/2015  CLINICAL DATA:  Patient having difficulty breathing that began today. Patient states that he has a HX of asthma. EXAM: PORTABLE CHEST 1 VIEW COMPARISON:  03/20/2015 FINDINGS: Midline trachea. Borderline cardiomegaly. Mediastinal contours otherwise within normal limits. No pleural effusion or pneumothorax. Clear lungs. Mild hyperinflation. IMPRESSION: No acute cardiopulmonary disease. Borderline cardiomegaly with mild hyperinflation. Electronically Signed   By: Jeronimo GreavesKyle  Talbot M.D.   On: 03/21/2015 11:50    EKG:  Orders placed or performed during the hospital encounter of 03/21/15  . EKG 12-Lead  . EKG 12-Lead    ASSESSMENT AND PLAN:  Active Problems:   COPD exacerbation (HCC) 1. COPD exacerbation. Continue patient on current medications with steroids, inhalation therapy, nebulizers seem to be improving 2. Acute bronchitis, sputum cultures if possible. Continue patient on levofloxacin orally 3.  Tobacco abuse, ongoing. Discussed this patient for approximately 3 minutes. Nicotine replacement therapy is initiated 4. Leukocytosis, likely due to steroids. Follow clinically and lab wise,  5. Hyperglycemia. No diabetes. Hemoglobin A1c 5.4     Management plans discussed with the patient, family and they are in agreement.   DRUG ALLERGIES: No Known Allergies  CODE STATUS:     Code Status Orders        Start     Ordered   03/21/15 1344  Full code   Continuous     03/21/15 1343      TOTAL TIME TAKING CARE OF THIS PATIENT: 40utes.    Katharina CaperVAICKUTE,Bernie Fobes M.D on 03/22/2015 at 2:32 PM  Between 7am to 6pm - Pager -  [phone]  After 6pm go to [email or web address] - password [email or web address]  [facility]  Office  [phone]  CC: Primary care physician; [employer]

## 2015-03-22 NOTE — Progress Notes (Signed)
Patient c/o headache x1 relieved well with prn Tylenol VSS Wife at bedside Continues on Iv solumedrol Possible D/C tomorrow if feeling better per MD

## 2015-03-22 NOTE — Care Management (Signed)
Admitted to [facility] with the diagnosis of COPD. Lives with wife, Alphonzo LemmingsWhitney [phone]). Last was at the [facility] 2-3 months ago. Will update Lelon MastLorrie Holt about this admission. Goes to [facility] for his prescriptions. Takes care of all basic activities of daily living himself. States he no longer drives since diagnosed with seizures. Wife drives and takes care of errands. Uses no aids for ambulation. No home health. No skilled facility. No home oxygen. Wife or daughter will transport Gwenette GreetBrenda S Julies Carmickle RN MSN [facility] Care Management [phone]

## 2015-03-23 DIAGNOSIS — J209 Acute bronchitis, unspecified: Secondary | ICD-10-CM

## 2015-03-23 DIAGNOSIS — I10 Essential (primary) hypertension: Secondary | ICD-10-CM

## 2015-03-23 DIAGNOSIS — Z720 Tobacco use: Secondary | ICD-10-CM

## 2015-03-23 DIAGNOSIS — D72829 Elevated white blood cell count, unspecified: Secondary | ICD-10-CM

## 2015-03-23 DIAGNOSIS — E785 Hyperlipidemia, unspecified: Secondary | ICD-10-CM

## 2015-03-23 DIAGNOSIS — R739 Hyperglycemia, unspecified: Secondary | ICD-10-CM

## 2015-03-23 LAB — GLUCOSE, CAPILLARY
GLUCOSE-CAPILLARY: 229 mg/dL — AB (ref 65–99)
Glucose-Capillary: 157 mg/dL — ABNORMAL HIGH (ref 65–99)

## 2015-03-23 MED ORDER — GUAIFENESIN ER 600 MG PO TB12: 600.0000 mg | ORAL_TABLET | Freq: Two times a day (BID) | ORAL | Status: AC

## 2015-03-23 MED ORDER — NICOTINE 7 MG/24HR TD PT24: 7.0000 mg | MEDICATED_PATCH | Freq: Every day | TRANSDERMAL | Status: AC

## 2015-03-23 MED ORDER — HYDROCOD POLST-CPM POLST ER 10-8 MG/5ML PO SUER: 5.0000 mL | Freq: Two times a day (BID) | ORAL | Status: AC | PRN

## 2015-03-23 MED ORDER — PREDNISONE 10 MG (21) PO TBPK: 10.0000 mg | ORAL_TABLET | Freq: Every day | ORAL | Status: AC

## 2015-03-23 MED ORDER — IPRATROPIUM-ALBUTEROL 0.5-2.5 (3) MG/3ML IN SOLN: 3.0000 mL | RESPIRATORY_TRACT | Status: AC

## 2015-03-23 MED ORDER — LEVOFLOXACIN 500 MG PO TABS: 500.0000 mg | ORAL_TABLET | Freq: Every day | ORAL | Status: AC

## 2015-03-23 MED ORDER — NICOTINE POLACRILEX 2 MG MT GUM: 2.0000 mg | CHEWING_GUM | OROMUCOSAL | Status: AC | PRN

## 2015-03-23 NOTE — Discharge Summary (Signed)
[facility] [place] [facility]   PATIENT NAME: Mathew Harper    MR#:  [id number]  DATE OF BIRTH:  10/07/1965  DATE OF ADMISSION:  03/21/2015 ADMITTING PHYSICIAN: Alford Highland, MD  DATE OF DISCHARGE: 03/23/2015 12:50 PM  PRIMARY CARE PHYSICIAN: [employer]     ADMISSION DIAGNOSIS:  COPD with exacerbation (HCC) [J44.1]  DISCHARGE DIAGNOSIS:  Principal Problem:   COPD exacerbation (HCC) Active Problems:   Acute bronchitis   Tobacco abuse   Leukocytosis   Hyperglycemia   Essential hypertension   Hyperlipidemia   SECONDARY DIAGNOSIS:   Past Medical History  Diagnosis Date  . Asthma   . COPD (chronic obstructive pulmonary disease) (HCC)   . Hypertension   . Hypercholesteremia   . Cirrhosis of liver (HCC)   . Psoriasis   . Hyperlipidemia     .pro HOSPITAL COURSE:   The patient is 49 year old Caucasian male with past medical history significant for ongoing tobacco, alcohol abuse, hypertension, hyperlipidemia who presents to the hospital with complaints of shortness of breath, cough, wheezing, feeling presyncopal, with cough. He was noted to have COPD exacerbation. On arrival to emergency room and was admitted. He was initiated on steroids, inhalation therapy, nebulizers as well as antibiotics. She chest x-ray showed no pneumonia, but hyperinflation and borderline cardiomegaly. This conservative therapy. However, patient's condition improved and he was ready to be discharged home Discussion by problem 1. COPD exacerbation. Taper steroids, continue inhalation therapy, nebulizers , antibiotics, follow-up with primary care physician for further recommendations, clinically improving 2. Acute bronchitis, unable to obtain sputum cultures  Continue patient on levofloxacin orally to complete course 3. Tobacco abuse, ongoing. Discussed this patient for approximately 3 minutes. Nicotine replacement therapy was initiated 4. Leukocytosis, likely  due to steroids. Follow clinically and lab wise as outpatient,  5. Hyperglycemia. No diabetes. Hemoglobin A1c 5.4   DISCHARGE CONDITIONS:   Stable  CONSULTS OBTAINED:     DRUG ALLERGIES:  No Known Allergies  DISCHARGE MEDICATIONS:   Discharge Medication List as of 03/23/2015 10:23 AM    START taking these medications   Details  chlorpheniramine-HYDROcodone (TUSSIONEX) 10-8 MG/5ML SUER Take 5 mLs by mouth every 12 (twelve) hours as needed for cough., Starting 03/23/2015, Until Discontinued, Normal    guaiFENesin (MUCINEX) 600 MG 12 hr tablet Take 1 tablet (600 mg total) by mouth 2 (two) times daily., Starting 03/23/2015, Until Discontinued, Normal    ipratropium-albuterol (DUONEB) 0.5-2.5 (3) MG/3ML SOLN Take 3 mLs by nebulization every 4 (four) hours., Starting 03/23/2015, Until Discontinued, Normal    nicotine (NICODERM CQ - DOSED IN MG/24 HR) 7 mg/24hr patch Place 1 patch (7 mg total) onto the skin daily., Starting 03/23/2015, Until Discontinued, Normal    nicotine polacrilex (NICORETTE) 2 MG gum Take 1 each (2 mg total) by mouth as needed for smoking cessation., Starting 03/23/2015, Until Discontinued, Normal      CONTINUE these medications which have CHANGED   Details  levofloxacin (LEVAQUIN) 500 MG tablet Take 1 tablet (500 mg total) by mouth daily., Starting 03/23/2015, Until Discontinued, Normal    predniSONE (STERAPRED UNI-PAK 21 TAB) 10 MG (21) TBPK tablet Take 1 tablet (10 mg total) by mouth daily. Take 6 tablets tomorrow in the morning then  taper by 1 tablet daily until finished, Starting 03/23/2015, Until Discontinued, Normal      CONTINUE these medications which have NOT CHANGED   Details  albuterol (PROVENTIL HFA;VENTOLIN HFA) 108 (90 BASE) MCG/ACT inhaler Inhale 1-2 puffs into  the lungs every 6 (six) hours as needed for wheezing or shortness of breath., Until Discontinued, Historical Med    Fluticasone-Salmeterol (ADVAIR) 250-50 MCG/DOSE AEPB Inhale 1 puff  into the lungs 2 (two) times daily., Until Discontinued, Historical Med    lisinopril (PRINIVIL,ZESTRIL) 5 MG tablet Take 5 mg by mouth daily., Until Discontinued, Historical Med    rosuvastatin (CRESTOR) 5 MG tablet Take 5 mg by mouth daily., Until Discontinued, Historical Med      STOP taking these medications     albuterol (PROVENTIL) (2.5 MG/3ML) 0.083% nebulizer solution          DISCHARGE INSTRUCTIONS:    Patient is to follow-up with his [facility] physician [facility]  If you experience worsening of your admission symptoms, develop shortness of breath, life threatening emergency, suicidal or homicidal thoughts you must seek medical attention immediately by calling 911 or calling your MD immediately  if symptoms less severe.  You Must read complete instructions/literature along with all the possible adverse reactions/side effects for all the Medicines you take and that have been prescribed to you. Take any new Medicines after you have completely understood and accept all the possible adverse reactions/side effects.   Please note  You were cared for by a hospitalist during your hospital stay. If you have any questions about your discharge medications or the care you received while you were in the hospital after you are discharged, you can call the unit and asked to speak with the hospitalist on call if the hospitalist that took care of you is not available. Once you are discharged, your primary care physician will handle any further medical issues. Please note that NO REFILLS for any discharge medications will be authorized once you are discharged, as it is imperative that you return to your primary care physician (or establish a relationship with a primary care physician if you do not have one) for your aftercare needs so that they can reassess your need for medications and monitor your lab values.    Today   CHIEF COMPLAINT:   Chief Complaint  Patient presents  with  . Respiratory Distress    HISTORY OF PRESENT ILLNESS:  Mathew Harper  is a 49 y.o. male with a known history of ongoing tobacco, alcohol abuse, hypertension, hyperlipidemia who presents to the hospital with complaints of shortness of breath, cough, wheezing, feeling presyncopal, with cough. He was noted to have COPD exacerbation. On arrival to emergency room and was admitted. He was initiated on steroids, inhalation therapy, nebulizers as well as antibiotics. She chest x-ray showed no pneumonia, but hyperinflation and borderline cardiomegaly. This conservative therapy. However, patient's condition improved and he was ready to be discharged home Discussion by problem 1. COPD exacerbation. Taper steroids, continue inhalation therapy, nebulizers , antibiotics, follow-up with primary care physician for further recommendations, clinically improving 2. Acute bronchitis, unable to obtain sputum cultures  Continue patient on levofloxacin orally to complete course 3. Tobacco abuse, ongoing. Discussed this patient for approximately 3 minutes. Nicotine replacement therapy was initiated 4. Leukocytosis, likely due to steroids. Follow clinically and lab wise as outpatient,  5. Hyperglycemia. No diabetes. Hemoglobin A1c 5.4     VITAL SIGNS:  Blood pressure 113/77, pulse 93, temperature 97.9 F (36.6 C), temperature source Oral, resp. rate 19, height 5\' 9"  (1.753 m), weight 88.905 kg (196 lb), SpO2 92 %.  I/O:   Intake/Output Summary (Last 24 hours) at 03/23/15 1449 Last data filed at 03/23/15 0800  Gross  per 24 hour  Intake    480 ml  Output      0 ml  Net    480 ml    PHYSICAL EXAMINATION:  GENERAL:  49 y.o.-year-old patient lying in the bed with no acute distress.  EYES: Pupils equal, round, reactive to light and accommodation. No scleral icterus. Extraocular muscles intact.  HEENT: Head atraumatic, normocephalic. Oropharynx and nasopharynx clear.  NECK:  Supple, no jugular venous  distention. No thyroid enlargement, no tenderness.  LUNGS: Normal breath sounds bilaterally, intermittent scattered wheezing, no rales,rhonchi or crepitation. No use of accessory muscles of respiration.  CARDIOVASCULAR: S1, S2 normal. No murmurs, rubs, or gallops.  ABDOMEN: Soft, non-tender, non-distended. Bowel sounds present. No organomegaly or mass.  EXTREMITIES: No pedal edema, cyanosis, or clubbing.  NEUROLOGIC: Cranial nerves II through XII are intact. Muscle strength 5/5 in all extremities. Sensation intact. Gait not checked.  PSYCHIATRIC: The patient is alert and oriented x 3.  SKIN: No obvious rash, lesion, or ulcer.   DATA REVIEW:   CBC  Recent Labs Lab 03/22/15 0557  WBC 14.2*  HGB 15.7  HCT 46.4  PLT 187    Chemistries   Recent Labs Lab 03/21/15 1126 03/22/15 0557  NA 139 139  K 3.9 3.9  CL 107 107  CO2 21* 21*  GLUCOSE 151* 183*  BUN 12 15  CREATININE 0.94 1.06  CALCIUM 9.3 9.1  AST 32  --   ALT 23  --   ALKPHOS 32*  --   BILITOT 0.7  --     Cardiac Enzymes  Recent Labs Lab 03/21/15 1126  TROPONINI <0.03    Microbiology Results  No results found for this or any previous visit.  RADIOLOGY:  No results found.  EKG:   Orders placed or performed during the hospital encounter of 03/21/15  . EKG 12-Lead  . EKG 12-Lead      Management plans discussed with the patient, family and they are in agreement.  CODE STATUS:     Code Status Orders        Start     Ordered   03/21/15 1344  Full code   Continuous     03/21/15 1343      TOTAL TIME TAKING CARE OF THIS PATIENT: 40 minutes.    Katharina Caper M.D on 03/23/2015 at 2:49 PM  Between 7am to 6pm - Pager - [phone]  After 6pm go to [email or web address] - password [email or web address] [facility]  [place] [facility]  Office  [phone]  CC: Primary care physician; [employer]

## 2015-03-23 NOTE — Progress Notes (Signed)
Pt being discharged home, discharge instructions and prescriptions reviewed with pt and wife, states understanding, pt with no complaints of pain, no distress or discomfort noted, pt states that he "feels much better and is glad to be going home"

## 2015-03-23 NOTE — Plan of Care (Signed)
Problem: Health Behavior/Discharge Planning: Goal: Ability to manage health-related needs will improve Outcome: Progressing Pt verbalizes need to stop smoking and effect smoking has on COPD complications.  Problem: Activity: Goal: Risk for activity intolerance will decrease Outcome: Progressing Pt able to shower and reports less dyspnea  Problem: Fluid Volume: Goal: Ability to maintain a balanced intake and output will improve Outcome: Progressing Pt tolerating food and fluids.

## 2015-03-23 NOTE — Discharge Instructions (Signed)

## 2015-11-08 DIAGNOSIS — G40909 Epilepsy, unspecified, not intractable, without status epilepticus: Secondary | ICD-10-CM

## 2015-11-08 DIAGNOSIS — K7460 Unspecified cirrhosis of liver: Secondary | ICD-10-CM

## 2015-11-08 DIAGNOSIS — J45909 Unspecified asthma, uncomplicated: Secondary | ICD-10-CM

## 2016-03-07 ENCOUNTER — Telehealth: Payer: Self-pay

## 2016-03-07 NOTE — Telephone Encounter (Signed)
[facility] on iprat-albut 0.5-3 (2.5)mg.  Per our records, patient has not been seen in over a year.  Needs to resubmit eligibility requirements before any medication refills can be completed or appointments.  Faxed refill request with note regarding not being able to [facility] [phone]) around 5:30pm.

## 2016-07-10 ENCOUNTER — Telehealth: Payer: Self-pay | Admitting: Pharmacist

## 2016-07-10 NOTE — Telephone Encounter (Signed)
07/10/16 Patient is currently Inactive at [facility] have received financial information. We do not have current Intake application, contract or Producer, television/film/video. Will forward to [place].

## 2016-08-27 ENCOUNTER — Telehealth: Payer: Self-pay | Admitting: Pharmacy Technician

## 2016-08-27 NOTE — Telephone Encounter (Signed)
Patient eligible to receive medication assistance at [facility] until 04/25/17 as long as eligibility requirements continue to be met.  Betty J. Kluttz Care Manager [facility] 

## 2016-09-04 IMAGING — CR DG CHEST 1V PORT
1 series · 1 of 1 positions shown · non-contrast
Comparison: 03/20/2015

CLINICAL DATA: Patient having difficulty breathing that began
today. Patient states that he has a HX of asthma.

EXAM:
PORTABLE CHEST 1 VIEW

[ap]
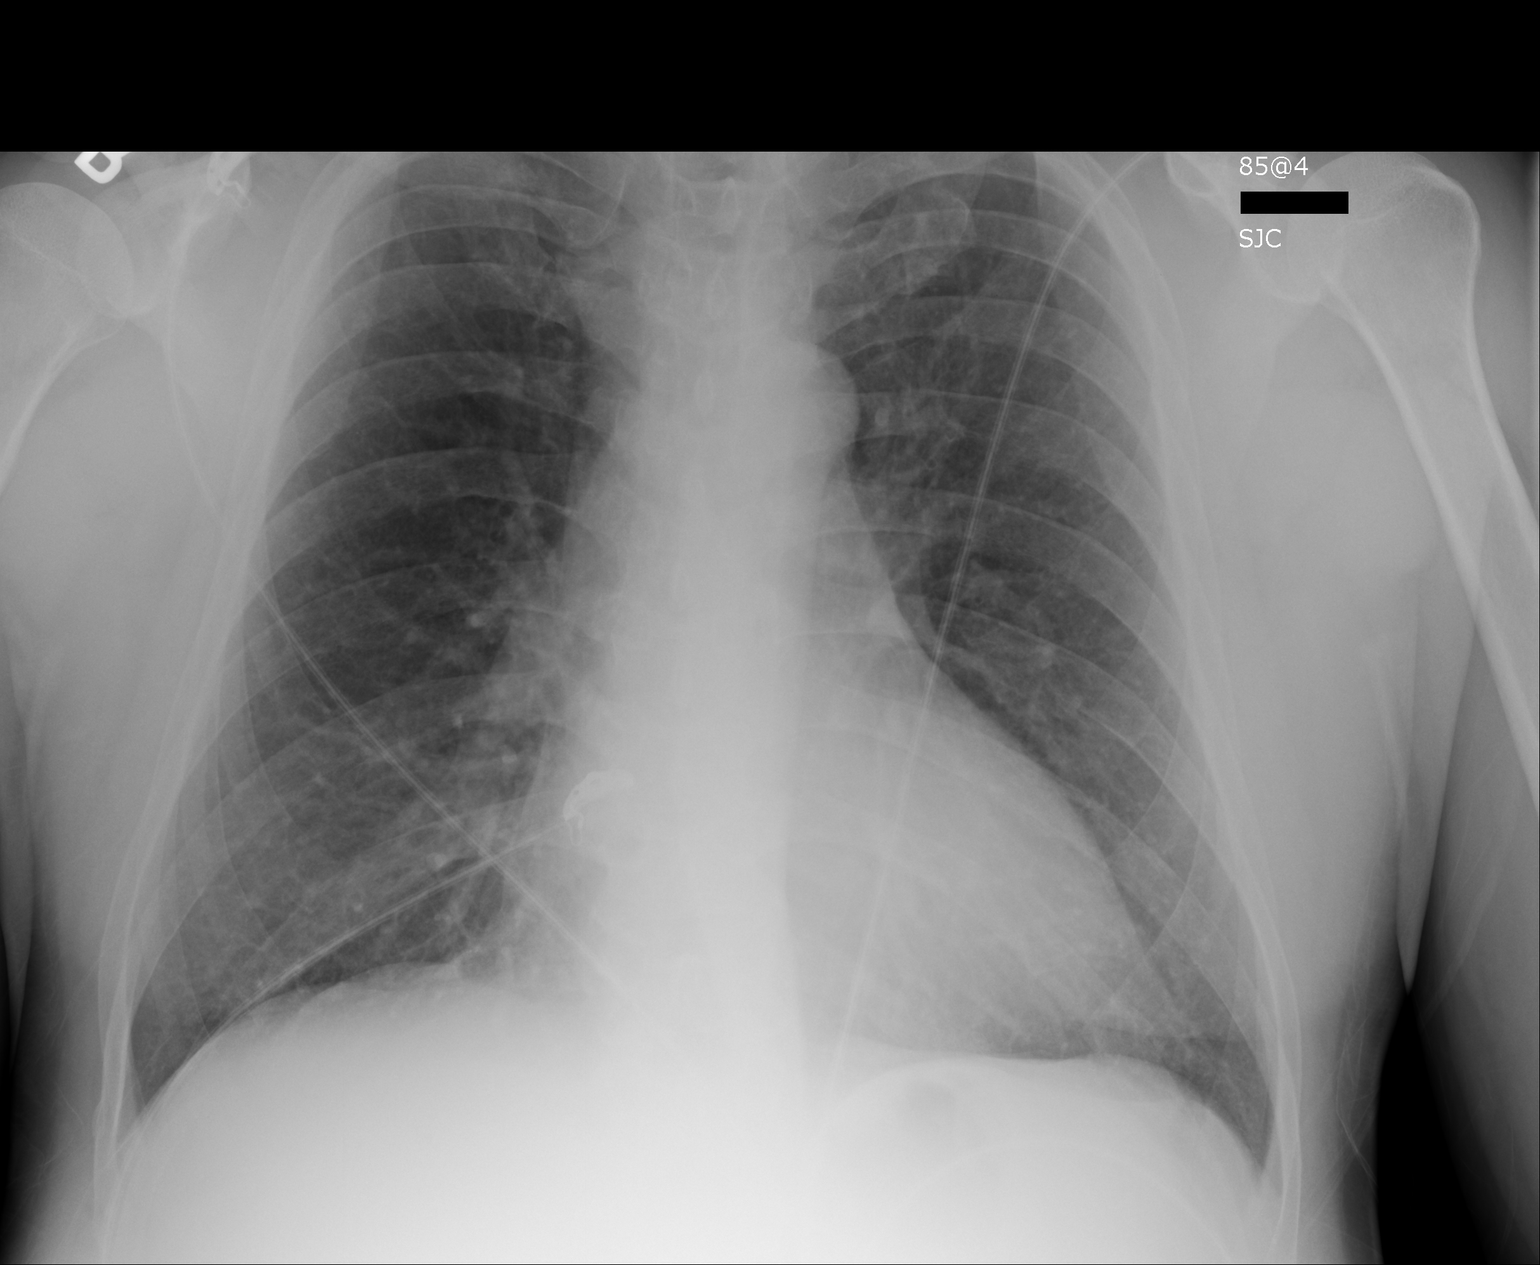

[1 of 1 positions shown; findings below may reference images not displayed]

FINDINGS: Midline trachea. Borderline cardiomegaly. Mediastinal contours
otherwise within normal limits. No pleural effusion or pneumothorax.
Clear lungs. Mild hyperinflation.
IMPRESSION: No acute cardiopulmonary disease.

Borderline cardiomegaly with mild hyperinflation.

## 2016-12-20 ENCOUNTER — Telehealth: Payer: Self-pay | Admitting: Pharmacist

## 2016-12-20 NOTE — Telephone Encounter (Signed)
12/20/16 I have received the signed scripts back from the provider, holding for patient to return his portion.Mathew Harper

## 2017-02-10 ENCOUNTER — Telehealth: Payer: Self-pay | Admitting: Pharmacist

## 2017-02-10 NOTE — Telephone Encounter (Signed)
02/10/17 Mailing Denial letter to patient for Advair & Jerrye BushyVentolin-Paperwork was mailed to patient Sept 18, for patient to sign and return, patient never returned paperwork. Copy of denial in patient chart, giving [place] S[place]Rita a copy of letter and [facility].Forde RadonAJ

## 2017-02-18 ENCOUNTER — Telehealth: Payer: Self-pay | Admitting: Pharmacist

## 2017-02-18 NOTE — Telephone Encounter (Signed)
02/18/17 Patient wife in office today, requested we reprint forms (Advair & Ventolin) for her to take to husband to sign and they will return them to our office, I have printed and giving to [place]. Also reprinted scripts will need to get new scripts due to old date on previous.Forde RadonAJ

## 2017-03-13 ENCOUNTER — Telehealth: Payer: Self-pay | Admitting: Pharmacist

## 2017-03-13 NOTE — Telephone Encounter (Signed)
°  03/13/17 Fax [employer] Re Enrollment application with scripts for Ventolin HFA 90mcg Inhale 2 puffs every four to six hours as needed & Advair 250/50 Inhale 1 puff two times a day, rinse mouth and spit after each use. Forde RadonAJ

## 2017-05-26 ENCOUNTER — Telehealth: Payer: Self-pay | Admitting: Pharmacist

## 2017-05-26 NOTE — Telephone Encounter (Signed)
05/26/17 Placed refill online with [facility] for Advair 250/50 & Ventolin HFA, to release 06/06/17, order# [id number].Forde RadonAJ

## 2017-05-30 ENCOUNTER — Telehealth: Payer: Self-pay | Admitting: Pharmacy Technician

## 2017-05-30 NOTE — Telephone Encounter (Signed)
Patient failed to provide 2019 financial documentation.  No additional medication assistance will be provided by [facility] without the required proof of income documentation.  Patient notified by letter.  Aarilyn Dye J. Mykhia Danish Care Manager [facility] 

## 2017-07-03 ENCOUNTER — Telehealth: Payer: Self-pay | Admitting: Pharmacy Technician

## 2017-07-03 NOTE — Telephone Encounter (Signed)
Received updated proof of income.  Patient eligible to receive medication assistance at [facility] through 2019, as long as eligibility requirements continue to be met.  Cai Anfinson J. Mayce Noyes Care Manager [facility] 

## 2017-08-26 ENCOUNTER — Telehealth: Payer: Self-pay | Admitting: Pharmacist

## 2017-08-26 NOTE — Telephone Encounter (Signed)
08/26/2017 2:15:13 PM - Ventolin HFA & Advair 250/50 refills  08/26/17 Placed refills online with [facility] for Ventolin HFA & Advair 250/50, to release 09/08/17, order# [id number].Forde RadonAJ

## 2017-11-25 ENCOUNTER — Telehealth: Payer: Self-pay | Admitting: Pharmacist

## 2017-11-25 NOTE — Telephone Encounter (Signed)
11/25/2017 12:10:48 PM - Advair & Ventolin refills  11/25/17 Placed refills online with [facility] for Ventolin HFA & Advair 250/50-to release 12/08/17, order# [id number].Forde Radon

## 2018-06-10 ENCOUNTER — Telehealth: Payer: Self-pay | Admitting: Pharmacy Technician

## 2018-06-10 NOTE — Telephone Encounter (Signed)
Received 2020 proof of income.  Patient eligible to receive medication assistance at [facility] as long as eligibility requirements continue to be met.  [place] [facility]

## 2018-06-11 ENCOUNTER — Telehealth: Payer: Self-pay | Admitting: Pharmacist

## 2018-06-11 NOTE — Telephone Encounter (Signed)
06/11/2018 8:39:26 AM - Advair & Ventolin script to [facility]  06/11/2018 Today I have picked up the patient portion of [employer] application for renewal I mailed to patient 03/09/2018 to sign & return. I am printing scripts for Adviar 250/50 & Ventolin-taking to [facility], since patient has signed his portion and have current income.Forde Radon

## 2018-06-22 ENCOUNTER — Telehealth: Payer: Self-pay | Admitting: Pharmacist

## 2018-06-22 NOTE — Telephone Encounter (Signed)
06/22/2018 2:10:25 PM - Advair 250/50 & Ventolin HFA  06/22/2018 Faxed [employer] renewal application for Advair 250/50 Inhale 1 puff two times a day, rinse mouth and spit after each use & Ventolin HFA Inhale 2 puffs every four to six hours as needed.Forde Radon

## 2018-08-20 ENCOUNTER — Telehealth: Payer: Self-pay | Admitting: Pharmacist

## 2018-08-20 NOTE — Telephone Encounter (Signed)
08/20/2018 9:27:59 AM - Advair 250/50 & Ventolin refills  08/20/2018 Placed refills online with [facility] for Ventolin HFA & Advair 250/50, to ship 09/18/2018, order# [id number].Forde Radon

## 2018-10-26 ENCOUNTER — Telehealth: Payer: Self-pay | Admitting: Pharmacist

## 2018-10-26 NOTE — Telephone Encounter (Signed)
Advair 250/50 & Ventolin HFA refill online with [facility]:  10/26/2018 Placed refill online with [facility] for Ventolin HFA, to ship 11/06/2018, Order# [id number].Delos Haring

## 2018-10-26 NOTE — Telephone Encounter (Signed)
CORRECTION TO PREVIOUS NOTE 10/26/2018 Places refill online with [facility] for Ventolin HFA & Advair 250/50, to ship 11/06/2018, order# [id number].Delos Haring

## 2019-01-13 ENCOUNTER — Telehealth: Payer: Self-pay | Admitting: Pharmacist

## 2019-01-13 NOTE — Telephone Encounter (Signed)
01/13/2019 2:10:41 PM - Refill online with [place] for Ventolin & Advair  01/13/2019 Placed refills online with [place] for Ventolin & Advair 250/50, to ship 01/26/2019, order# [id number].Delos Haring

## 2019-04-23 ENCOUNTER — Telehealth: Payer: Self-pay | Admitting: Pharmacist

## 2019-04-23 NOTE — Telephone Encounter (Signed)
04/23/2019 12:06:34 PM - Advair & Ventolin forms to pat. & dr  04/23/2019 Time for patient to renew with [employer] for Ventolin & Advair--Printed [employer] application-mailing patient his portion to sign & return with current household income/support & taxes/[id number]. Also sending scripts to [facility] for provider to sign & return.Forde Radon

## 2019-05-21 ENCOUNTER — Telehealth: Payer: Self-pay | Admitting: Pharmacist

## 2019-05-21 NOTE — Telephone Encounter (Signed)
05/21/2019 8:41:01 AM - Advair & Ventolin pending  -- Rhetta Mura - Friday, May 21, 2019 8:39 AM -- I have received the signed scripts for Ventolin & Advair back from provider, holding for patient to return his portion, mailed to patient 04/23/2019 to sign & return with all current household financials.  Patient enrollment with [employer] ends 06/22/2019.Forde Radon

## 2020-03-22 ENCOUNTER — Telehealth: Payer: Self-pay | Admitting: Pharmacy Technician

## 2020-03-22 NOTE — Telephone Encounter (Signed)
Patient failed to provide 2021 proof of income.  No additional medication assistance will be provided by [facility] without the required proof of income documentation.  Patient notified by letter.  Sherilyn Dacosta Care Manager [facility]   [address] [place], [place]  [address]   March 21, 2020    Mathew Harper, Mathew Harper [address] [place], [place]  [address]  Dear Mathew Harper:  This is to inform you that you are no longer eligible to receive medication assistance at [facility].  The reason(s) are:    _____Your total gross monthly household income exceeds 250% of the Federal Poverty Level.   _____Tangible assets (savings, checking, stocks/bonds, pension, retirement, etc.) exceeds our limit  _____You are eligible to receive benefits from [facility], [facility] or HIV Medication              Assistance Program _____You are eligible to receive benefits from a Medicare Part D plan _____You have prescription insurance  _____You are not an [facility] resident __X__Failure to provide all requested proof of income information for 2021.    Medication assistance will resume once all requested financial information has been returned to our clinic.  If you have questions, please contact our clinic at [phone].    Thank you,  [facility]

## 2023-01-07 ENCOUNTER — Ambulatory Visit: Payer: Medicaid Other | Admitting: Internal Medicine
